# Patient Record
Sex: Female | Born: 1980 | Race: White | Hispanic: No | Marital: Single | Smoking: Current every day smoker
Health system: Southern US, Community
[De-identification: ages and names within clinical notes are randomized; demographics above are authoritative.]

## PROBLEM LIST (undated history)

## (undated) DIAGNOSIS — M357 Hypermobility syndrome: Secondary | ICD-10-CM

## (undated) DIAGNOSIS — M7918 Myalgia, other site: Secondary | ICD-10-CM

## (undated) DIAGNOSIS — Z720 Tobacco use: Secondary | ICD-10-CM

## (undated) HISTORY — DX: Hypermobility syndrome: M35.7

## (undated) HISTORY — PX: EYE SURGERY: SHX253

## (undated) HISTORY — PX: ABDOMINAL HYSTERECTOMY: SHX81

## (undated) HISTORY — PX: FRACTURE SURGERY: SHX138

## (undated) HISTORY — DX: Tobacco use: Z72.0

## (undated) HISTORY — DX: Myalgia, other site: M79.18

---

## 2018-10-03 ENCOUNTER — Emergency Department (INDEPENDENT_AMBULATORY_CARE_PROVIDER_SITE_OTHER)
Admission: EM | Admit: 2018-10-03 | Discharge: 2018-10-03 | Disposition: A | Discharge status: Home / Self Care | Payer: BC Managed Care – PPO | Source: Home / Self Care

## 2018-10-03 ENCOUNTER — Other Ambulatory Visit: Payer: Self-pay

## 2018-10-03 DIAGNOSIS — J039 Acute tonsillitis, unspecified: Secondary | ICD-10-CM

## 2018-10-03 DIAGNOSIS — R51 Headache: Secondary | ICD-10-CM

## 2018-10-03 DIAGNOSIS — R52 Pain, unspecified: Secondary | ICD-10-CM

## 2018-10-03 DIAGNOSIS — R519 Headache, unspecified: Secondary | ICD-10-CM

## 2018-10-03 NOTE — Discharge Instructions (Signed)
  You may take 500mg acetaminophen every 4-6 hours or in combination with ibuprofen 400-600mg every 6-8 hours as needed for pain, inflammation, and fever.  Be sure to well hydrated with clear liquids and get at least 8 hours of sleep at night, preferably more while sick.   Please follow up with family medicine in 1 week if needed.   

## 2018-10-03 NOTE — ED Triage Notes (Signed)
Sore throat on and off x 1 week.  White sores more on the left.  Headache and ear pain, more on the left.

## 2018-10-03 NOTE — ED Provider Notes (Signed)
[name]    CSN: [id number] Arrival date & time: 10/03/18  1140     History   Chief Complaint Chief Complaint  Patient presents with  . Sore Throat    HPI Thai Hemrick is a 38 y.o. female.   HPI Autumn Ray is a 38 y.o. female presenting to UC with c/o intermittent sore throat that started 1 week ago.  She has noticed white spots, more on her Left tonsil the last few days. Associated generalized HA, Left ear pain, body aches, and mild abdominal pain. Pt works at [employer] and is required to wear a mask due to Covid-19 protocol. Denies known sick contacts. Denies fever, chills, n/v/d.    History reviewed. No pertinent past medical history.  There are no active problems to display for this patient.   Past Surgical History:  Procedure Laterality Date  . ABDOMINAL HYSTERECTOMY    . EYE SURGERY    . FRACTURE SURGERY      OB History   No obstetric history on file.      Home Medications    Prior to Admission medications   Not on File    Family History History reviewed. No pertinent family history.  Social History Social History   Tobacco Use  . Smoking status: Not on file  Substance Use Topics  . Alcohol use: Not on file  . Drug use: Not on file     Allergies   Contrast media [iodinated diagnostic agents]   Review of Systems Review of Systems  Constitutional: Negative for chills and fever.  HENT: Negative for congestion, ear pain, trouble swallowing and voice change.   Respiratory: Negative for cough and shortness of breath.   Cardiovascular: Negative for chest pain and palpitations.  Gastrointestinal: Negative for abdominal pain, diarrhea, nausea and vomiting.  Musculoskeletal: Positive for arthralgias and myalgias. Negative for back pain.  Skin: Negative for rash.  Neurological: Positive for headaches. Negative for dizziness and light-headedness.     Physical Exam Triage Vital Signs ED Triage Vitals  Enc Vitals Group     BP 10/03/18 1207 121/79     Pulse Rate 10/03/18 1207 77     Resp 10/03/18 1207 20     Temp 10/03/18 1207 98.3 F (36.8 C)     Temp Source 10/03/18 1207 Oral     SpO2 10/03/18 1207 100 %     Weight 10/03/18 1208 157 lb (71.2 kg)     Height 10/03/18 1208 5\' 9"  (1.753 m)     Head Circumference --      Peak Flow --      Pain Score 10/03/18 1208 4     Pain Loc --      Pain Edu? --      Excl. in [place]? --    No data found.  Updated Vital Signs BP 121/79 (BP Location: Right Arm)   Pulse 77   Temp 98.3 F (36.8 C) (Oral)   Resp 20   Ht 5\' 9"  (1.753 m)   Wt 157 lb (71.2 kg)   SpO2 100%   BMI 23.18 kg/m   Visual Acuity Right Eye Distance:   Left Eye Distance:   Bilateral Distance:    Right Eye Near:   Left Eye Near:    Bilateral Near:     Physical Exam Vitals signs and nursing note reviewed.  Constitutional:      Appearance: She is well-developed.  HENT:     Head: Normocephalic and atraumatic.  Right Ear: Tympanic membrane normal.     Left Ear: Tympanic membrane normal.     Nose: Nose normal.     Right Sinus: No maxillary sinus tenderness or frontal sinus tenderness.     Left Sinus: No maxillary sinus tenderness or frontal sinus tenderness.     Mouth/Throat:     Lips: Pink.     Mouth: Mucous membranes are moist.     Pharynx: Uvula midline. Posterior oropharyngeal erythema present. No pharyngeal swelling or uvula swelling.     Tonsils: Tonsillar exudate (mainly Left side) present. No tonsillar abscesses.  Neck:     Musculoskeletal: Normal range of motion and neck supple.  Cardiovascular:     Rate and Rhythm: Normal rate and regular rhythm.  Pulmonary:     Effort: Pulmonary effort is normal. No respiratory distress.     Breath sounds: Normal breath sounds. No stridor. No wheezing, rhonchi or rales.  Musculoskeletal: Normal range of motion.  Lymphadenopathy:     Cervical: Cervical adenopathy present.  Skin:    General: Skin is warm and dry.  Neurological:      Mental Status: She is alert and oriented to person, place, and time.  Psychiatric:        Behavior: Behavior normal.      UC Treatments / Results  Labs (all labs ordered are listed, but only abnormal results are displayed) Labs Reviewed  STREP A DNA PROBE  SARS-COV-2 RNA, QUALITATIVE REAL-TIME RT-PCR  POCT RAPID STREP A (OFFICE)    EKG None  Radiology No results found.  Procedures Procedures (including critical care time)  Medications Ordered in UC Medications - No data to display  Initial Impression / Assessment and Plan / UC Course  I have reviewed the triage vital signs and the nursing notes.  Pertinent labs & imaging results that were available during my care of the patient were reviewed by me and considered in my medical decision making (see chart for details).     Rapid strep: NEGATIVE Culture sent Due to pt being exposed to the public throughout the day and multiple symptoms, [facility].  Final Clinical Impressions(s) / UC Diagnoses   Final diagnoses:  Exudative tonsillitis  Generalized headache  Body aches     Discharge Instructions      You may take 500mg  acetaminophen every 4-6 hours or in combination with ibuprofen 400-600mg  every 6-8 hours as needed for pain, inflammation, and fever.  Be sure to well hydrated with clear liquids and get at least 8 hours of sleep at night, preferably more while sick.   Please follow up with family medicine in 1 week if needed.     ED Prescriptions    None     Controlled Substance Prescriptions [place] Controlled Substance Registry consulted? Not Applicable   Rolla Platehelps, Anais Koenen O, PA-C 10/03/18 1310

## 2018-10-04 ENCOUNTER — Other Ambulatory Visit: Payer: Self-pay

## 2018-10-04 ENCOUNTER — Telehealth: Payer: Self-pay | Admitting: Emergency Medicine

## 2018-10-04 ENCOUNTER — Emergency Department
Admission: EM | Admit: 2018-10-04 | Discharge: 2018-10-04 | Disposition: A | Discharge status: Home / Self Care | Payer: BC Managed Care – PPO | Source: Home / Self Care | Attending: Emergency Medicine | Admitting: Emergency Medicine

## 2018-10-04 ENCOUNTER — Encounter: Payer: Self-pay | Admitting: Emergency Medicine

## 2018-10-04 DIAGNOSIS — J039 Acute tonsillitis, unspecified: Secondary | ICD-10-CM

## 2018-10-04 DIAGNOSIS — R1012 Left upper quadrant pain: Secondary | ICD-10-CM

## 2018-10-04 DIAGNOSIS — R5383 Other fatigue: Secondary | ICD-10-CM | POA: Diagnosis not present

## 2018-10-04 LAB — POCT CBC W AUTO DIFF (K'VILLE URGENT CARE)

## 2018-10-04 LAB — STREP A DNA PROBE: Group A Strep Probe: NOT DETECTED

## 2018-10-04 MED ORDER — CEPHALEXIN 500 MG PO CAPS: 500.0000 mg | ORAL_CAPSULE | Freq: Two times a day (BID) | ORAL | 0 refills | Status: DC

## 2018-10-04 MED ORDER — FLUCONAZOLE 150 MG PO TABS: 150.0000 mg | ORAL_TABLET | Freq: Once | ORAL | 0 refills | Status: AC

## 2018-10-04 NOTE — Telephone Encounter (Signed)
Patient called back to say not improving and is concerned; spoke with Dr.Daub and she will come in to day for additional labs.

## 2018-10-04 NOTE — Telephone Encounter (Signed)
Message left on voice mail inquiring about patient's status and encouraging patient to call with questions/concerns.Reported negative strep DNA.

## 2018-10-04 NOTE — Discharge Instructions (Addendum)
Please make sure you drink plenty of fluids. Continue ibuprofen and Tylenol. Take antibiotics as instructed. Please check regularly regarding your COVID test which was done yesterday. Please stay out of work until you have results of your COVID test.  I have ordered multiple blood test and will call you as we receive the results.  The culture may take up to a week.

## 2018-10-04 NOTE — ED Provider Notes (Signed)
[facility]    CSN: [id number] Arrival date & time: 10/04/18  1150     History   Chief Complaint Chief Complaint  Patient presents with  . Follow-up    HPI Autumn Ray is a 38 y.o. female.   HPI Patient was seen here yesterday and I asked her to return to clinic today for reevaluation.  She has developed patches of exudate involving both tonsils.  She has felt bad for approximately 1 week.  She initially started with myalgias and fatigue.  48 hours prior to admission she developed a severe sore throat associated with exudate.  She was seen yesterday throat culture was done and she was treated symptomatically with ibuprofen and Tylenol.  Strep screen was negative as well as strep DNA probe.  She enters today with severe sore throat associated with temperature 99+.  She does feel like her glands are also swollen.  She did had COVID testing done yesterday.  She has had a minimal cough. History reviewed. No pertinent past medical history.  There are no active problems to display for this patient.   Past Surgical History:  Procedure Laterality Date  . ABDOMINAL HYSTERECTOMY    . EYE SURGERY    . FRACTURE SURGERY      OB History   No obstetric history on file.      Home Medications    Prior to Admission medications   Medication Sig Start Date End Date Taking? Authorizing Provider  cephALEXin (KEFLEX) 500 MG capsule Take 1 capsule (500 mg total) by mouth 2 (two) times daily. 10/04/18   Collene Gobbleaub, Kaylan Yates A, MD  fluconazole (DIFLUCAN) 150 MG tablet Take 1 tablet (150 mg total) by mouth once for 1 dose. Repeat if needed 10/04/18 10/04/18  Collene Gobbleaub, Lanae Federer A, MD    Family History No family history on file.  Social History Social History   Tobacco Use  . Smoking status: Not on file  Substance Use Topics  . Alcohol use: Not on file  . Drug use: Not on file     Allergies   Contrast media [iodinated diagnostic agents]   Review of Systems Review of Systems   Constitutional: Positive for fatigue and fever.  HENT: Positive for sore throat.   Eyes: Negative.   Respiratory: Positive for cough.   Cardiovascular: Negative.   Gastrointestinal: Positive for abdominal pain.  Genitourinary: Negative.   Musculoskeletal: Positive for myalgias.  Neurological: Negative.   Psychiatric/Behavioral: Negative.      Physical Exam Triage Vital Signs ED Triage Vitals  Enc Vitals Group     BP 10/04/18 1209 117/82     Pulse --      Resp 10/04/18 1209 16     Temp 10/04/18 1209 98.6 F (37 C)     Temp Source 10/04/18 1209 Oral     SpO2 10/04/18 1209 99 %     Weight --      Height --      Head Circumference --      Peak Flow --      Pain Score 10/04/18 1210 3     Pain Loc --      Pain Edu? --      Excl. in [place]? --    No data found.  Updated Vital Signs BP 117/82 (BP Location: Right Arm)   Pulse 68   Temp 98.6 F (37 C) (Oral)   Resp 16   SpO2 99% Heart rate of 68 Heart rate 68 Visual Acuity Right Eye  Distance:   Left Eye Distance:   Bilateral Distance:    Right Eye Near:   Left Eye Near:    Bilateral Near:     Physical Exam Constitutional:      Appearance: Normal appearance. She is not toxic-appearing.  HENT:     Head: Normocephalic.     Right Ear: Tympanic membrane normal.     Left Ear: Tympanic membrane normal.     Nose: Nose normal.     Mouth/Throat:     Mouth: Mucous membranes are moist.     Pharynx: Oropharyngeal exudate and posterior oropharyngeal erythema present.     Comments: The tonsils are 2+ enlarged.  They are cryptic in appearance and the crypts are filled with exudate.  There are bilateral tender anterior cervical node but minimal posterior cervical nodes Eyes:     Pupils: Pupils are equal, round, and reactive to light.  Cardiovascular:     Rate and Rhythm: Normal rate and regular rhythm.  Pulmonary:     Effort: Pulmonary effort is normal.  Abdominal:     Comments: The abdomen is flat there is tenderness in the  midepigastrium extending into the left upper abdomen.  Musculoskeletal: Normal range of motion.  Lymphadenopathy:     Cervical: Cervical adenopathy present.  Skin:    General: Skin is warm and dry.  Neurological:     General: No focal deficit present.     Mental Status: She is alert.  Psychiatric:        Mood and Affect: Mood normal.        Behavior: Behavior normal.   White cell count 9100. 71% granulocytes. Hemoglobin 13.7. Platelet 247,000.   UC Treatments / Results  Labs (all labs ordered are listed, but only abnormal results are displayed) Labs Reviewed  HERPES SIMPLEX VIRUS CULTURE  EPSTEIN-BARR VIRUS VCA, IGM  EPSTEIN-BARR VIRUS VCA, IGG  EPSTEIN-BARR VIRUS NUCLEAR ANTIGEN ANTIBODY, IGG  EPSTEIN-BARR VIRUS EARLY D ANTIGEN ANTIBODY, IGG  CMV ABS, IGG+IGM (CYTOMEGALOVIRUS)  COMPLETE METABOLIC PANEL WITH GFR  POCT CBC W AUTO DIFF ([facility])  POCT CBC W AUTO DIFF ([facility])    EKG None  Radiology No results found.  Procedures Procedures (including critical care time)  Medications Ordered in UC Medications - No data to display  Initial Impression / Assessment and Plan / UC Course  I have reviewed the triage vital signs and the nursing notes.  Pertinent labs & imaging results that were available during my care of the patient were reviewed by me and considered in my medical decision making (see chart for details). Exam is suspicious of a viral type illness with exudative tonsillitis along with left upper quadrant abdominal pain and white count of 9100.  She does have cryptic tonsils with exudate.  Testing was done for EBV infection as well as CMV.  I also did a throat culture for HSV.  She has been in a stable relationship for 2 years.  She will be on cephalexin 500 3 times daily and given a prescription for Diflucan for secondary yeast infection.  Follow-up in 48 to 72 hours if not improving.  She was encouraged to force fluids and continue  ibuprofen and acetaminophen as instructed.     Final Clinical Impressions(s) / UC Diagnoses   Final diagnoses:  Exudative tonsillitis  Other fatigue  Abdominal pain, left upper quadrant     Discharge Instructions     Please make sure you drink plenty of fluids. Continue ibuprofen and  Tylenol. Take antibiotics as instructed. Please check regularly regarding your COVID test which was done yesterday. Please stay out of work until you have results of your COVID test.  I have ordered multiple blood test and will call you as we receive the results.  The culture may take up to a week.    ED Prescriptions    Medication Sig Dispense Auth. Provider   cephALEXin (KEFLEX) 500 MG capsule Take 1 capsule (500 mg total) by mouth 2 (two) times daily. 20 capsule Darlyne Russian, MD   fluconazole (DIFLUCAN) 150 MG tablet Take 1 tablet (150 mg total) by mouth once for 1 dose. Repeat if needed 2 tablet Darlyne Russian, MD     Controlled Substance Prescriptions [place] Controlled Substance Registry consulted? Not Applicable   Darlyne Russian, MD 10/04/18 1306

## 2018-10-04 NOTE — ED Triage Notes (Signed)
Patient here for follow up labs; when called today she reported worsening symptoms and spoke with Dr. Everlene Farrier it was decided she would come in for more labs.

## 2018-10-05 LAB — COMPLETE METABOLIC PANEL WITH GFR
AG Ratio: 1.7 (calc) (ref 1.0–2.5)
ALT: 8 U/L (ref 6–29)
AST: 10 U/L (ref 10–30)
Albumin: 3.8 g/dL (ref 3.6–5.1)
Alkaline phosphatase (APISO): 61 U/L (ref 31–125)
BUN: 7 mg/dL (ref 7–25)
CO2: 28 mmol/L (ref 20–32)
Calcium: 8.8 mg/dL (ref 8.6–10.2)
Chloride: 108 mmol/L (ref 98–110)
Creat: 0.61 mg/dL (ref 0.50–1.10)
GFR, Est African American: 132 mL/min/{1.73_m2} (ref >=60)
GFR, Est Non African American: 114 mL/min/{1.73_m2} (ref >=60)
Globulin: 2.3 g/dL (calc) (ref 1.9–3.7)
Glucose, Bld: 89 mg/dL (ref 65–99)
Potassium: 4.2 mmol/L (ref 3.5–5.3)
Sodium: 139 mmol/L (ref 135–146)
Total Bilirubin: 0.4 mg/dL (ref 0.2–1.2)
Total Protein: 6.1 g/dL (ref 6.1–8.1)

## 2018-10-05 LAB — SARS-COV-2 RNA,(COVID-19) QUALITATIVE NAAT: SARS CoV2 RNA: NOT DETECTED

## 2018-10-06 ENCOUNTER — Telehealth: Payer: Self-pay | Admitting: Emergency Medicine

## 2018-10-06 NOTE — Telephone Encounter (Signed)
Spoke with patient and told her cmp and covid negative; hsv pending. She states her throat is almost completely healed.

## 2018-10-07 LAB — CMV ABS, IGG+IGM (CYTOMEGALOVIRUS)
CMV IgM: <30 AU/mL
Cytomegalovirus Ab-IgG: 6.3 U/mL — ABNORMAL HIGH

## 2018-10-07 LAB — EPSTEIN-BARR VIRUS NUCLEAR ANTIGEN ANTIBODY, IGG: EBV NA IgG: 378 U/mL — ABNORMAL HIGH

## 2018-10-07 LAB — EPSTEIN-BARR VIRUS VCA, IGG: EBV VCA IgG: 482 U/mL — ABNORMAL HIGH

## 2018-10-07 LAB — EPSTEIN-BARR VIRUS VCA, IGM: EBV VCA IgM: <36 U/mL

## 2018-10-07 LAB — EPSTEIN-BARR VIRUS EARLY D ANTIGEN ANTIBODY, IGG: EBV EA IgG: 10 U/mL — ABNORMAL HIGH

## 2018-10-08 LAB — HERPES SIMPLEX VIRUS CULTURE
MICRO NUMBER:: 615591
SPECIMEN QUALITY:: ADEQUATE

## 2018-10-08 NOTE — Telephone Encounter (Signed)
Patient returned our call; all lab results given to her.

## 2018-10-08 NOTE — Telephone Encounter (Signed)
Message left on voice mail inquiring about patient's status and encouraging patient to call for lab results.

## 2018-10-14 LAB — POCT CBC W AUTO DIFF (K'VILLE URGENT CARE)

## 2019-08-28 ENCOUNTER — Other Ambulatory Visit: Payer: Self-pay

## 2019-08-28 ENCOUNTER — Emergency Department (INDEPENDENT_AMBULATORY_CARE_PROVIDER_SITE_OTHER)
Admission: EM | Admit: 2019-08-28 | Discharge: 2019-08-28 | Disposition: A | Discharge status: Home / Self Care | Payer: BC Managed Care – PPO | Source: Home / Self Care | Attending: Family Medicine | Admitting: Family Medicine

## 2019-08-28 DIAGNOSIS — J209 Acute bronchitis, unspecified: Secondary | ICD-10-CM

## 2019-08-28 DIAGNOSIS — J069 Acute upper respiratory infection, unspecified: Secondary | ICD-10-CM

## 2019-08-28 MED ORDER — ALBUTEROL SULFATE HFA 108 (90 BASE) MCG/ACT IN AERS: 2.0000 | INHALATION_SPRAY | Freq: Four times a day (QID) | RESPIRATORY_TRACT | 0 refills | Status: DC | PRN

## 2019-08-28 MED ORDER — BENZONATATE 200 MG PO CAPS: ORAL_CAPSULE | ORAL | 0 refills | Status: DC

## 2019-08-28 MED ORDER — AZITHROMYCIN 250 MG PO TABS: ORAL_TABLET | ORAL | 0 refills | Status: DC

## 2019-08-28 MED ORDER — PREDNISONE 20 MG PO TABS: ORAL_TABLET | ORAL | 0 refills | Status: DC

## 2019-08-28 NOTE — ED Triage Notes (Signed)
Pt c/o seasonal allergies that have progressed to what she describes as bronchitis. She has had same symptoms in the past years. She took mucinex otc and it has not relieved her symptoms. No concerns of covid exposure and she has been fully vaccinated.

## 2019-08-28 NOTE — Discharge Instructions (Addendum)
Take plain guaifenesin (1200mg extended release tabs such as Mucinex) twice daily, with plenty of water, for cough and congestion.  May add Pseudoephedrine (30mg, one or two every 4 to 6 hours) for sinus congestion.  Get adequate rest.   °Try warm salt water gargles for sore throat.  °Stop all antihistamines for now, and other non-prescription cough/cold preparations. °  °

## 2019-08-28 NOTE — ED Provider Notes (Signed)
[facility]    CSN: [id number] Arrival date & time: 08/28/19  1055      History   Chief Complaint Chief Complaint  Patient presents with  . Cough  . Nasal Congestion  . Chest Pain    unchanged pt has had bronchitis in the past several years no concerns    HPI Autumn Ray is a 39 y.o. female.   Patient has seasonal rhinitis.  About 2 weeks ago she developed a productive cough and increased sinus congestion.  Her cough has become worse.  During the past five days she has developed mild sore throat with cough and chills/sweats.  She states that she gets a similar persistent illness each spring and fall that usually requires treatment with prednisone.  She has had improvement in the past with an albuterol inhaler.  She has had COVID19 vaccinations. Her daughter has asthma.  The history is provided by the patient.    History reviewed. No pertinent past medical history.  There are no problems to display for this patient.   Past Surgical History:  Procedure Laterality Date  . ABDOMINAL HYSTERECTOMY    . EYE SURGERY    . FRACTURE SURGERY      OB History   No obstetric history on file.      Home Medications    Prior to Admission medications   Medication Sig Start Date End Date Taking? Authorizing Provider  albuterol (VENTOLIN HFA) 108 (90 Base) MCG/ACT inhaler Inhale 2 puffs into the lungs every 6 (six) hours as needed for wheezing or shortness of breath. 08/28/19   Lattie Haw, MD  azithromycin (ZITHROMAX Z-PAK) 250 MG tablet Take 2 tabs today; then begin one tab once daily for 4 more days. 08/28/19   Lattie Haw, MD  benzonatate (TESSALON) 200 MG capsule Take one cap by mouth at bedtime as needed for cough.  May repeat in 4 to 6 hours 08/28/19   Lattie Haw, MD  cephALEXin (KEFLEX) 500 MG capsule Take 1 capsule (500 mg total) by mouth 2 (two) times daily. 10/04/18   Collene Gobble, MD  predniSONE (DELTASONE) 20 MG tablet Take one tab by mouth  twice daily for 4 days, then one daily for 3 days. Take with food. 08/28/19   Lattie Haw, MD    Family History Family History  Problem Relation Age of Onset  . Heart failure Mother   . Stroke Mother   . Hyperlipidemia Mother   . COPD Mother   . Hypertension Sister   . Hyperlipidemia Sister   . Diabetes Sister     Social History Social History   Tobacco Use  . Smoking status: Former Smoker    Quit date: 04/09/2008    Years since quitting: 11.3  . Smokeless tobacco: Never Used  Substance Use Topics  . Alcohol use: Not on file  . Drug use: Not on file     Allergies   Contrast media [iodinated diagnostic agents]   Review of Systems Review of Systems + sore throat + cough No pleuritic pain ? wheezing + nasal congestion + post-nasal drainage + sinus pain/pressure No itchy/red eyes No earache No hemoptysis No SOB No fever, + chills/sweats No nausea No vomiting No abdominal pain No diarrhea No urinary symptoms No skin rash + fatigue No myalgias No headache    Physical Exam Triage Vital Signs ED Triage Vitals  Enc Vitals Group     BP 08/28/19 1104 124/81     Pulse --  Resp 08/28/19 1104 19     Temp 08/28/19 1104 99 F (37.2 C)     Temp Source 08/28/19 1104 Oral     SpO2 08/28/19 1104 99 %     Weight 08/28/19 1107 140 lb (63.5 kg)     Height 08/28/19 1107 5\' 9"  (1.753 m)     Head Circumference --      Peak Flow --      Pain Score 08/28/19 1107 0     Pain Loc --      Pain Edu? --      Excl. in [place]? --    No data found.  Updated Vital Signs BP 124/81 (BP Location: Left Arm)   Temp 99 F (37.2 C) (Oral)   Resp 19   Ht 5\' 9"  (1.753 m)   Wt 63.5 kg   SpO2 99%   BMI 20.67 kg/m   Visual Acuity Right Eye Distance:   Left Eye Distance:   Bilateral Distance:    Right Eye Near:   Left Eye Near:    Bilateral Near:     Physical Exam Nursing notes and Vital Signs reviewed. Appearance:  Patient appears stated age, and in no acute  distress Eyes:  Pupils are equal, round, and reactive to light and accomodation.  Extraocular movement is intact.  Conjunctivae are not inflamed  Ears:  Canals normal.  Tympanic membranes normal.  Nose:  Congested turbinates.  Maxillary sinus tenderness is present.  Pharynx:  Normal Neck:  Supple.  Mildly enlarged lateral nodes are present, tender to palpation on the left.   Lungs:  Clear to auscultation.  Breath sounds are equal.  Moving air well. Heart:  Regular rate and rhythm without murmurs, rubs, or gallops.  Abdomen:  Nontender without masses or hepatosplenomegaly.  Bowel sounds are present.  No CVA or flank tenderness.  Extremities:  No edema.  Skin:  No rash present.   UC Treatments / Results  Labs (all labs ordered are listed, but only abnormal results are displayed) Labs Reviewed - No data to display  EKG   Radiology No results found.  Procedures Procedures (including critical care time)  Medications Ordered in UC Medications - No data to display  Initial Impression / Assessment and Plan / UC Course  I have reviewed the triage vital signs and the nursing notes.  Pertinent labs & imaging results that were available during my care of the patient were reviewed by me and considered in my medical decision making (see chart for details).    Suspect mild underlying reactive airways disease.  Begin prednisone burst/taper, Z-Pak, Tessalon, and albuterol inhaler. Followup with Family Doctor if not improved in about 10 days.   Final Clinical Impressions(s) / UC Diagnoses   Final diagnoses:  Viral URI with cough  Acute bronchitis, unspecified organism     Discharge Instructions     Take plain guaifenesin (1200mg  extended release tabs such as Mucinex) twice daily, with plenty of water, for cough and congestion.  May add Pseudoephedrine (30mg , one or two every 4 to 6 hours) for sinus congestion.  Get adequate rest.   Try warm salt water gargles for sore throat.  Stop  all antihistamines for now, and other non-prescription cough/cold preparations.      ED Prescriptions    Medication Sig Dispense Auth. Provider   azithromycin (ZITHROMAX Z-PAK) 250 MG tablet Take 2 tabs today; then begin one tab once daily for 4 more days. 6 tablet 08/30/19, MD  predniSONE (DELTASONE) 20 MG tablet Take one tab by mouth twice daily for 4 days, then one daily for 3 days. Take with food. 11 tablet Kandra Nicolas, MD   benzonatate (TESSALON) 200 MG capsule Take one cap by mouth at bedtime as needed for cough.  May repeat in 4 to 6 hours 15 capsule Kandra Nicolas, MD   albuterol (VENTOLIN HFA) 108 (90 Base) MCG/ACT inhaler Inhale 2 puffs into the lungs every 6 (six) hours as needed for wheezing or shortness of breath. 18 g Kandra Nicolas, MD        Kandra Nicolas, MD 08/28/19 [phone]

## 2020-01-20 ENCOUNTER — Emergency Department (INDEPENDENT_AMBULATORY_CARE_PROVIDER_SITE_OTHER)
Admission: EM | Admit: 2020-01-20 | Discharge: 2020-01-20 | Disposition: A | Discharge status: Home / Self Care | Payer: BC Managed Care – PPO | Source: Home / Self Care | Attending: Family Medicine | Admitting: Family Medicine

## 2020-01-20 ENCOUNTER — Other Ambulatory Visit: Payer: Self-pay

## 2020-01-20 DIAGNOSIS — M79662 Pain in left lower leg: Secondary | ICD-10-CM

## 2020-01-20 NOTE — ED Provider Notes (Signed)
[facility]    CSN: [id number] Arrival date & time: 01/20/20  1748      History   Chief Complaint Chief Complaint  Patient presents with  . Leg Pain    Left    HPI Autumn Ray is a 39 y.o. female.   Patient noticed some vague mild pain in her left lateral calf yesterday.  At about one PM today she raised up on her toes (plantar flexion) and felt sudden sharp pain in her left lateral proximal calf.  She recalls no injury or recent change in activities.  She denies chest pain or shortness of breath.  No history of DVT.  The history is provided by the patient.  Leg Pain Location:  Leg Time since incident:  5 hours Injury: no   Leg location:  L lower leg Pain details:    Quality:  Sharp   Radiates to:  Does not radiate   Severity:  Moderate   Onset quality:  Sudden   Duration:  5 hours   Timing:  Constant   Progression:  Unchanged Chronicity:  New Prior injury to area:  No Relieved by:  None tried Worsened by:  Bearing weight and activity Ineffective treatments:  None tried Associated symptoms: no decreased ROM, no fatigue, no fever, no muscle weakness, no numbness, no stiffness, no swelling and no tingling     History reviewed. No pertinent past medical history.  There are no problems to display for this patient.   Past Surgical History:  Procedure Laterality Date  . ABDOMINAL HYSTERECTOMY    . EYE SURGERY    . FRACTURE SURGERY      OB History   No obstetric history on file.      Home Medications    Prior to Admission medications   Medication Sig Start Date End Date Taking? Authorizing Provider  albuterol (VENTOLIN HFA) 108 (90 Base) MCG/ACT inhaler Inhale 2 puffs into the lungs every 6 (six) hours as needed for wheezing or shortness of breath. 08/28/19   Lattie Haw, MD  azithromycin (ZITHROMAX Z-PAK) 250 MG tablet Take 2 tabs today; then begin one tab once daily for 4 more days. 08/28/19   Lattie Haw, MD  benzonatate (TESSALON)  200 MG capsule Take one cap by mouth at bedtime as needed for cough.  May repeat in 4 to 6 hours 08/28/19   Lattie Haw, MD  cephALEXin (KEFLEX) 500 MG capsule Take 1 capsule (500 mg total) by mouth 2 (two) times daily. 10/04/18   Collene Gobble, MD  predniSONE (DELTASONE) 20 MG tablet Take one tab by mouth twice daily for 4 days, then one daily for 3 days. Take with food. 08/28/19   Lattie Haw, MD    Family History Family History  Problem Relation Age of Onset  . Heart failure Mother   . Stroke Mother   . Hyperlipidemia Mother   . COPD Mother   . Hypertension Sister   . Hyperlipidemia Sister   . Diabetes Sister     Social History Social History   Tobacco Use  . Smoking status: Former Smoker    Quit date: 04/09/2008    Years since quitting: 11.7  . Smokeless tobacco: Never Used  Vaping Use  . Vaping Use: Every day  Substance Use Topics  . Alcohol use: Not Currently  . Drug use: Not on file     Allergies   Contrast media [iodinated diagnostic agents]   Review of Systems Review of  Systems  Constitutional: Negative for activity change, appetite change, chills, diaphoresis, fatigue and fever.  Respiratory: Negative for cough, chest tightness, shortness of breath, wheezing and stridor.   Cardiovascular: Negative for chest pain and leg swelling.  Musculoskeletal: Negative for joint swelling and stiffness.  Skin: Negative for color change.  All other systems reviewed and are negative.    Physical Exam Triage Vital Signs ED Triage Vitals  Enc Vitals Group     BP 01/20/20 1810 100/64     Pulse Rate 01/20/20 1810 85     Resp 01/20/20 1810 16     Temp 01/20/20 1810 98.4 F (36.9 C)     Temp Source 01/20/20 1810 Oral     SpO2 01/20/20 1810 99 %     Weight --      Height --      Head Circumference --      Peak Flow --      Pain Score 01/20/20 1808 8     Pain Loc --      Pain Edu? --      Excl. in GC? --    No data found.  Updated Vital Signs BP 100/64  (BP Location: Right Arm)   Pulse 85   Temp 98.4 F (36.9 C) (Oral)   Resp 16   SpO2 99%   Visual Acuity Right Eye Distance:   Left Eye Distance:   Bilateral Distance:    Right Eye Near:   Left Eye Near:    Bilateral Near:     Physical Exam Vitals and nursing note reviewed.  Constitutional:      General: She is not in acute distress. HENT:     Head: Normocephalic.     Mouth/Throat:     Pharynx: Oropharynx is clear.  Eyes:     Pupils: Pupils are equal, round, and reactive to light.  Cardiovascular:     Rate and Rhythm: Normal rate and regular rhythm.     Heart sounds: Normal heart sounds.  Pulmonary:     Effort: Pulmonary effort is normal. No respiratory distress.     Breath sounds: Normal breath sounds. No stridor.  Abdominal:     Palpations: Abdomen is soft.     Tenderness: There is no abdominal tenderness.  Musculoskeletal:     Cervical back: Neck supple.     Right lower leg: No edema.     Left lower leg: Tenderness present. No bony tenderness. No edema.       Legs:     Comments: Left proximal lower leg has vague localized tenderness to palpation laterally as noted on diagram.  There is no swelling, erythema, or warmth.  Distal neurovascular function is intact.   Lymphadenopathy:     Cervical: No cervical adenopathy.  Skin:    General: Skin is warm and dry.     Findings: No erythema.  Neurological:     Mental Status: She is alert.      UC Treatments / Results  Labs (all labs ordered are listed, but only abnormal results are displayed) Labs Reviewed  D-DIMER, QUANTITATIVE (NOT AT [facility])    EKG   Radiology No results found.  Procedures Procedures (including critical care time)  Medications Ordered in UC Medications - No data to display  Initial Impression / Assessment and Plan / UC Course  I have reviewed the triage vital signs and the nursing notes.  Pertinent labs & imaging results that were available during my care of the patient were  reviewed  by me and considered in my medical decision making (see chart for details).    Suspect muscle strain. Will check d-dimer as a precaution (if elevated, schedule venous US). Followup with Dr. Rodney [facility] ([facility]) if not improving about one wee.   Final Clinical Impressions(s) / UC Diagnoses   Final diagnoses:  Pain of left calf     Discharge Instructions     Elevate leg.  Apply ice pack for 20 to 30 minutes, 3 to 4 times daily  Continue until pain and swelling decrease.  May take Ibuprofen 200mg , 4 tabs every 8 hours with food.   If symptoms become significantly worse during the night or over the weekend, proceed to the local emergency room.     ED Prescriptions    None        , MD 01/24/20 [phone]

## 2020-01-20 NOTE — Discharge Instructions (Addendum)
Elevate leg.  Apply ice pack for 20 to 30 minutes, 3 to 4 times daily  Continue until pain and swelling decrease.  May take Ibuprofen 200mg , 4 tabs every 8 hours with food.   If symptoms become significantly worse during the night or over the weekend, proceed to the local emergency room.

## 2020-01-20 NOTE — ED Triage Notes (Signed)
Patient presents to Urgent Care with complaints of left calf pain since yesterday. Patient reports it hurts on either side about 1/3 of the way down from her knee, denies recent travel. No redness or swelling noted, is ambulatory but w/ limp.

## 2020-01-21 LAB — D-DIMER, QUANTITATIVE: D-Dimer, Quant: <0.19 ug{FEU}/mL

## 2020-02-02 ENCOUNTER — Telehealth: Payer: Self-pay | Admitting: Emergency Medicine

## 2020-02-02 NOTE — Telephone Encounter (Signed)
Call from Autumn Ray regarding the need for follow up regarding her left leg pain. Autumn Ray had an appointment w/ her new PCP and does not want to go back to that office [facility]). Pt had info for Dr T on discharge papers but has not made a follow up appointment. Autumn Ray also given Lexicographer for [facility] at [facility] as an alternative if she would like another option. [phone]. Patient thanked [place] for our time and for the pleasant experience she had here as a patient

## 2020-02-03 ENCOUNTER — Ambulatory Visit (INDEPENDENT_AMBULATORY_CARE_PROVIDER_SITE_OTHER): Payer: BC Managed Care – PPO | Admitting: Family Medicine

## 2020-02-03 ENCOUNTER — Ambulatory Visit: Payer: Self-pay

## 2020-02-03 ENCOUNTER — Encounter: Payer: Self-pay | Admitting: Family Medicine

## 2020-02-03 ENCOUNTER — Other Ambulatory Visit: Payer: Self-pay

## 2020-02-03 VITALS — BP 113/76 | HR 76 | Ht 69.0 in | Wt 135.0 lb

## 2020-02-03 DIAGNOSIS — S86812A Strain of other muscle(s) and tendon(s) at lower leg level, left leg, initial encounter: Secondary | ICD-10-CM | POA: Diagnosis not present

## 2020-02-03 DIAGNOSIS — S86819A Strain of other muscle(s) and tendon(s) at lower leg level, unspecified leg, initial encounter: Secondary | ICD-10-CM | POA: Insufficient documentation

## 2020-02-03 DIAGNOSIS — M79662 Pain in left lower leg: Secondary | ICD-10-CM

## 2020-02-03 NOTE — Assessment & Plan Note (Signed)
Injury occurred around 10/10.  Mild strain on ultrasound today. -Counseled on home exercise therapy and supportive care. -Provided heel lifts. -Counseled on compression. -Could consider physical therapy.

## 2020-02-03 NOTE — Patient Instructions (Signed)
Nice to meet you Please try the heel lifts.  Please try compression  Please try the exercises   Please send me a message in MyChart with any questions or updates.  Please see me back in 4 weeks.   --Dr. Jordan Likes

## 2020-02-03 NOTE — Progress Notes (Signed)
Autumn Ray - 39 y.o. female MRN [id number]  Date of birth: [birth year]  SUBJECTIVE:  Including CC & ROS.  Chief Complaint  Patient presents with  . Leg Pain    left calf x 2 weeks    Autumn Ray is a 39 y.o. female that is presenting with left calf pain.  The pain is been ongoing for about 10 days.  She reports stepping down having a painful sensation to the back of her calf.  The pain is still occurring.  No history of similar pain.  No history of surgery.  Pain is relieved with sitting down.  Review of Systems See HPI   HISTORY: Past Medical, Surgical, Social, and Family History Reviewed & Updated per EMR.   Pertinent Historical Findings include:  No past medical history on file.  Past Surgical History:  Procedure Laterality Date  . ABDOMINAL HYSTERECTOMY    . EYE SURGERY    . FRACTURE SURGERY      Family History  Problem Relation Age of Onset  . Heart failure Mother   . Stroke Mother   . Hyperlipidemia Mother   . COPD Mother   . Hypertension Sister   . Hyperlipidemia Sister   . Diabetes Sister     Social History   Socioeconomic History  . Marital status: Single    Spouse name: Not on file  . Number of children: Not on file  . Years of education: Not on file  . Highest education level: Not on file  Occupational History  . Not on file  Tobacco Use  . Smoking status: Former Smoker    Quit date: 04/09/2008    Years since quitting: 11.8  . Smokeless tobacco: Never Used  Vaping Use  . Vaping Use: Every day  Substance and Sexual Activity  . Alcohol use: Not Currently  . Drug use: Not on file  . Sexual activity: Not on file  Other Topics Concern  . Not on file  Social History Narrative  . Not on file   Social Determinants of Health   Financial Resource Strain:   . Difficulty of Paying Living Expenses: Not on file  Food Insecurity:   . Worried About Programme researcher, broadcasting/film/video in the Last Year: Not on file  . Ran Out of Food in the Last Year: Not on file    Transportation Needs:   . Lack of Transportation (Medical): Not on file  . Lack of Transportation (Non-Medical): Not on file  Physical Activity:   . Days of Exercise per Week: Not on file  . Minutes of Exercise per Session: Not on file  Stress:   . Feeling of Stress : Not on file  Social Connections:   . Frequency of Communication with Friends and Family: Not on file  . Frequency of Social Gatherings with Friends and Family: Not on file  . Attends Religious Services: Not on file  . Active Member of Clubs or Organizations: Not on file  . Attends Banker Meetings: Not on file  . Marital Status: Not on file  Intimate Partner Violence:   . Fear of Current or Ex-Partner: Not on file  . Emotionally Abused: Not on file  . Physically Abused: Not on file  . Sexually Abused: Not on file     PHYSICAL EXAM:  VS: BP 113/76   Pulse 76   Ht 5\' 9"  (1.753 m)   Wt 135 lb (61.2 kg)   BMI 19.94 kg/m  Physical Exam Gen: NAD, alert,  cooperative with exam, well-appearing MSK:  Left knee: Normal knee range of motion. Normal ankle range of motion. Pain with resistance to plantarflexion and dorsiflexion. Plantarflexion with Janee Morn test. Neurovascularly intact  Limited ultrasound: Left leg:  Normal-appearing Achilles tendon. Normal appearing musculotendinous junction of the Achilles into the medial gastrocnemius. There is a separation between the medial gastrocnemius and soleus to suggest a strain.  There is increased hyperemia in this area.  Summary: Findings associated with the calf strain.  Ultrasound and interpretation by Clare Gandy, MD    ASSESSMENT & PLAN:   Strain of calf muscle Injury occurred around 10/10.  Mild strain on ultrasound today. -Counseled on home exercise therapy and supportive care. -Provided heel lifts. -Counseled on compression. -Could consider physical therapy.

## 2020-03-01 ENCOUNTER — Ambulatory Visit: Payer: BC Managed Care – PPO | Admitting: Family Medicine

## 2020-03-02 ENCOUNTER — Ambulatory Visit: Payer: BC Managed Care – PPO | Admitting: Family Medicine

## 2020-03-09 ENCOUNTER — Ambulatory Visit (INDEPENDENT_AMBULATORY_CARE_PROVIDER_SITE_OTHER): Payer: BC Managed Care – PPO | Admitting: Family Medicine

## 2020-03-09 ENCOUNTER — Encounter: Payer: Self-pay | Admitting: Family Medicine

## 2020-03-09 ENCOUNTER — Other Ambulatory Visit: Payer: Self-pay

## 2020-03-09 VITALS — BP 118/76 | HR 69 | Ht 69.0 in | Wt 130.0 lb

## 2020-03-09 DIAGNOSIS — G2589 Other specified extrapyramidal and movement disorders: Secondary | ICD-10-CM | POA: Diagnosis not present

## 2020-03-09 DIAGNOSIS — M357 Hypermobility syndrome: Secondary | ICD-10-CM | POA: Diagnosis not present

## 2020-03-09 DIAGNOSIS — S86812D Strain of other muscle(s) and tendon(s) at lower leg level, left leg, subsequent encounter: Secondary | ICD-10-CM | POA: Diagnosis not present

## 2020-03-09 NOTE — Progress Notes (Signed)
Autumn Ray - 39 y.o. female MRN [id number]  Date of birth: 03/09/1981  SUBJECTIVE:  Including CC & ROS.  Chief Complaint  Patient presents with  . Follow-up    left calf muscle    Autumn Ray is a 39 y.o. female that is following up for left leg pain and presenting with right shoulder pain.  She has had improvement with the left calf pain.  It is intermittent and mild in nature.  She still notices it when she goes up and down ladders.  The right shoulder pain is posterior in nature.  It is more around the scapula.  She has had the pain for a few years.  She denies any injection.  She has gone through physical therapy with limited improvement.   Review of Systems See HPI   HISTORY: Past Medical, Surgical, Social, and Family History Reviewed & Updated per EMR.   Pertinent Historical Findings include:  No past medical history on file.  Past Surgical History:  Procedure Laterality Date  . ABDOMINAL HYSTERECTOMY    . EYE SURGERY    . FRACTURE SURGERY      Family History  Problem Relation Age of Onset  . Heart failure Mother   . Stroke Mother   . Hyperlipidemia Mother   . COPD Mother   . Hypertension Sister   . Hyperlipidemia Sister   . Diabetes Sister     Social History   Socioeconomic History  . Marital status: Single    Spouse name: Not on file  . Number of children: Not on file  . Years of education: Not on file  . Highest education level: Not on file  Occupational History  . Not on file  Tobacco Use  . Smoking status: Former Smoker    Quit date: 04/09/2008    Years since quitting: 11.9  . Smokeless tobacco: Never Used  Vaping Use  . Vaping Use: Every day  Substance and Sexual Activity  . Alcohol use: Not Currently  . Drug use: Not on file  . Sexual activity: Not on file  Other Topics Concern  . Not on file  Social History Narrative  . Not on file   Social Determinants of Health   Financial Resource Strain:   . Difficulty of Paying Living Expenses: Not  on file  Food Insecurity:   . Worried About Programme researcher, broadcasting/film/video in the Last Year: Not on file  . Ran Out of Food in the Last Year: Not on file  Transportation Needs:   . Lack of Transportation (Medical): Not on file  . Lack of Transportation (Non-Medical): Not on file  Physical Activity:   . Days of Exercise per Week: Not on file  . Minutes of Exercise per Session: Not on file  Stress:   . Feeling of Stress : Not on file  Social Connections:   . Frequency of Communication with Friends and Family: Not on file  . Frequency of Social Gatherings with Friends and Family: Not on file  . Attends Religious Services: Not on file  . Active Member of Clubs or Organizations: Not on file  . Attends Banker Meetings: Not on file  . Marital Status: Not on file  Intimate Partner Violence:   . Fear of Current or Ex-Partner: Not on file  . Emotionally Abused: Not on file  . Physically Abused: Not on file  . Sexually Abused: Not on file     PHYSICAL EXAM:  VS: BP 118/76   Pulse  69   Ht 5\' 9"  (1.753 m)   Wt 130 lb (59 kg)   BMI 19.20 kg/m  Physical Exam Gen: NAD, alert, cooperative with exam, well-appearing MSK:  Left leg: Normal strength resistance. Mild tenderness to palpation of the mid calf. Normal strength resistance. Right shoulder: Scapular function is impaired compared to contralateral side. Normal range of motion. Normal strength resistance. Normal empty can test. Normal speeds test. Normal O'Brien's test. Neurovascularly intact     ASSESSMENT & PLAN:   Strain of calf muscle Pain is only mild and intermittent.  She has had continued improvement of her symptoms. -Counseled on home exercise therapy and supportive care. -Counseled on compression. -Could consider further imaging if needed.  Hypermobility syndrome Beighton score 7/9. Likely contributing to some of her ongoing shoulder pain.  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  Has good strength on exam. -Counseled on home exercise therapy and supportive care. -Could consider physical therapy or further imaging.

## 2020-03-09 NOTE — Patient Instructions (Signed)
Good to see you Please try the exercises  Please try heat  Please try a thera cane or a lacrosse ball   Please send me a message in MyChart with any questions or updates.  Please see me back in 4-6 weeks.   --Dr. Jordan Likes

## 2020-03-09 NOTE — Assessment & Plan Note (Signed)
Beighton score 7/9. Likely contributing to some of her ongoing shoulder pain.

## 2020-03-09 NOTE — Assessment & Plan Note (Signed)
Pain is only mild and intermittent.  She has had continued improvement of her symptoms. -Counseled on home exercise therapy and supportive care. -Counseled on compression. -Could consider further imaging if needed.

## 2020-03-09 NOTE — Assessment & Plan Note (Signed)
The right scapula does not function as well as the left.  Has good strength on exam. -Counseled on home exercise therapy and supportive care. -Could consider physical therapy or further imaging.

## 2020-04-14 ENCOUNTER — Other Ambulatory Visit: Payer: Self-pay

## 2020-04-14 ENCOUNTER — Ambulatory Visit (INDEPENDENT_AMBULATORY_CARE_PROVIDER_SITE_OTHER): Payer: BC Managed Care – PPO | Admitting: Family Medicine

## 2020-04-14 ENCOUNTER — Ambulatory Visit: Payer: Self-pay

## 2020-04-14 VITALS — BP 126/76 | Ht 69.0 in | Wt 135.0 lb

## 2020-04-14 DIAGNOSIS — G2589 Other specified extrapyramidal and movement disorders: Secondary | ICD-10-CM | POA: Diagnosis not present

## 2020-04-14 MED ORDER — CYCLOBENZAPRINE HCL 10 MG PO TABS: 5.0000 mg | ORAL_TABLET | Freq: Three times a day (TID) | ORAL | 0 refills | Status: DC | PRN

## 2020-04-14 MED ORDER — PREDNISONE 5 MG PO TABS: ORAL_TABLET | ORAL | 0 refills | Status: DC

## 2020-04-14 NOTE — Assessment & Plan Note (Signed)
Most of her pain is on the medial and inferior border of the scapula.  She has minor changes around the shoulder joint itself.  Her symptoms are likely related to hypermobility. -Counseled on home exercise therapy and supportive care. -Prednisone. -Flexeril. -Referral to physical therapy. -Could consider trigger point injections.

## 2020-04-14 NOTE — Progress Notes (Signed)
  Autumn Ray - 40 y.o. female MRN [id number]  Date of birth: [birth year]  SUBJECTIVE:  Including CC & ROS.  No chief complaint on file.   Autumn Ray is a 40 y.o. female that is presenting with worsening right scapular pain.  Pain is occurring over the medial border and inferior border of the scapula.  It is worse with certain movements.  It is worse when she rolls over in bed at night.   Review of Systems See HPI   HISTORY: Past Medical, Surgical, Social, and Family History Reviewed & Updated per EMR.   Pertinent Historical Findings include:  No past medical history on file.  Past Surgical History:  Procedure Laterality Date  . ABDOMINAL HYSTERECTOMY    . EYE SURGERY    . FRACTURE SURGERY      Family History  Problem Relation Age of Onset  . Heart failure Mother   . Stroke Mother   . Hyperlipidemia Mother   . COPD Mother   . Hypertension Sister   . Hyperlipidemia Sister   . Diabetes Sister     Social History   Socioeconomic History  . Marital status: Single    Spouse name: Not on file  . Number of children: Not on file  . Years of education: Not on file  . Highest education level: Not on file  Occupational History  . Not on file  Tobacco Use  . Smoking status: Former Smoker    Quit date: 04/09/2008    Years since quitting: 12.0  . Smokeless tobacco: Never Used  Vaping Use  . Vaping Use: Every day  Substance and Sexual Activity  . Alcohol use: Not Currently  . Drug use: Not on file  . Sexual activity: Not on file  Other Topics Concern  . Not on file  Social History Narrative  . Not on file   Social Determinants of Health   Financial Resource Strain: Not on file  Food Insecurity: Not on file  Transportation Needs: Not on file  Physical Activity: Not on file  Stress: Not on file  Social Connections: Not on file  Intimate Partner Violence: Not on file     PHYSICAL EXAM:  VS: BP 126/76   Ht 5\' 9"  (1.753 m)   Wt 135 lb (61.2 kg)   BMI 19.94 kg/m   Physical Exam Gen: NAD, alert, cooperative with exam, well-appearing MSK:  Right shoulder: Normal range of motion. Normal strength resistance. Tenderness to palpation at the inferior medial border of the scapula. Neurovascular intact  Limited ultrasound: Right shoulder:  Normal-appearing biceps tendon. Normal-appearing subscapularis. There is a partial interstitial tear within the mid substance of the supraspinatus. It external rotation of the posterior glenohumeral joint shows some lability of the posterior labrum.  Summary: Minor change in supraspinatus and degenerative changes of the labrum.  Ultrasound and interpretation by , MD    ASSESSMENT & PLAN:   Scapular dyskinesis Most of her pain is on the medial and inferior border of the scapula.  She has minor changes around the shoulder joint itself.  Her symptoms are likely related to hypermobility. -Counseled on home exercise therapy and supportive care. -Prednisone. -Flexeril. -Referral to physical therapy. -Could consider trigger point injections.

## 2020-04-14 NOTE — Patient Instructions (Signed)
Good to see you Happy belated [place] Ouch!  Please try heat  Please try compression   Please try a thera cane or lacrosse ball  Please send me a message in MyChart with any questions or updates.  Please see me back in 4-6 weeks.   --Dr. Jordan Likes

## 2020-04-20 ENCOUNTER — Ambulatory Visit (INDEPENDENT_AMBULATORY_CARE_PROVIDER_SITE_OTHER): Payer: BC Managed Care – PPO | Admitting: Physical Therapy

## 2020-04-20 ENCOUNTER — Other Ambulatory Visit: Payer: Self-pay

## 2020-04-20 ENCOUNTER — Encounter: Payer: Self-pay | Admitting: Physical Therapy

## 2020-04-20 DIAGNOSIS — G8929 Other chronic pain: Secondary | ICD-10-CM

## 2020-04-20 DIAGNOSIS — M6281 Muscle weakness (generalized): Secondary | ICD-10-CM | POA: Diagnosis not present

## 2020-04-20 DIAGNOSIS — M25511 Pain in right shoulder: Secondary | ICD-10-CM

## 2020-04-20 DIAGNOSIS — M357 Hypermobility syndrome: Secondary | ICD-10-CM

## 2020-04-20 NOTE — Patient Instructions (Signed)
Access Code: [email or web address] URL: [email or web address] Date: 04/20/2020 Prepared by: Reggy [facility]  Exercises Standing Shoulder Horizontal Abduction with Resistance - 1 x daily - 7 x weekly - 3 sets - 10 reps Shoulder External Rotation Reactive Isometrics - 1 x daily - 7 x weekly - 3 sets - 10 reps Shoulder Internal Rotation Reactive Isometrics - 1 x daily - 7 x weekly - 3 sets - 10 reps

## 2020-04-20 NOTE — Therapy (Signed)
[facility] [place] [address] [place], [place], [address] Phone: [phone]   Fax:  [phone]  Physical Therapy Evaluation  Patient Details  Name: Autumn Ray MRN: [id number] Date of Birth: May 26, 1980 Referring Provider (PT): schmitz, jeremy   Encounter Date: 04/20/2020   PT End of Session - 04/20/20 1519    Visit Number 1    Number of Visits 12    Date for PT Re-Evaluation 06/01/20    PT Start Time 1430    PT Stop Time 1515    PT Time Calculation (min) 45 min    Activity Tolerance Patient tolerated treatment well    Behavior During Therapy [facility] for tasks assessed/performed           History reviewed. No pertinent past medical history.  Past Surgical History:  Procedure Laterality Date  . ABDOMINAL HYSTERECTOMY    . EYE SURGERY    . FRACTURE SURGERY      There were no vitals filed for this visit.    Subjective Assessment - 04/20/20 1435    Subjective Pt had Rt shoulder injury in 2018 and again in 2019 with both instances being workers comp injuries. Pt states pain has never gotten better and has recently gotten worse. Pt recently saw a new MD who diagnoses her with Rt shoulder hypermobility and pt states MD told her she has a slight tear in Rt rotator cuff mm. Pain worse with movement, better with rest and heat    Pertinent History shoulder injuries 2018 and 2019    Diagnostic tests ultrasound-results not final    Patient Stated Goals decrease pain    Currently in Pain? Yes    Pain Score 6     Pain Location Shoulder    Pain Orientation Right    Pain Descriptors / Indicators Burning;Aching    Pain Type Chronic pain    Pain Onset More than a month ago    Pain Frequency Intermittent    Aggravating Factors  movement    Pain Relieving Factors rest, heat    Effect of Pain on Daily Activities difficulty working              [facility] PT Assessment - 04/20/20 0001      Assessment   Medical Diagnosis scapular  dyskinesis    Referring Provider (PT) schmitz, jeremy    Onset Date/Surgical Date 03/14/17      Precautions   Precautions None      Balance Screen   Has the patient fallen in the past 6 months No      Observation/Other Assessments   Focus on Therapeutic Outcomes (FOTO)  functional status measure 54      ROM / Strength   AROM / PROM / Strength AROM;Strength      AROM   Overall AROM Comments bilat shoulder with increased ROM in all directions      Strength   Strength Assessment Site Shoulder;Hand    Right/Left Shoulder Right;Left    Right Shoulder Flexion 3+/5    Right Shoulder ABduction 4/5    Right Shoulder Internal Rotation 3+/5    Right Shoulder External Rotation 3+/5    Left Shoulder Flexion 4/5    Left Shoulder ABduction 4+/5    Left Shoulder Internal Rotation 4/5    Left Shoulder External Rotation 4/5    Right/Left hand Right;Left    Right Hand Grip (lbs) 63    Left Hand Grip (lbs) 70      Palpation  Palpation comment hypermobile in Rt shoulder joint, scapular mobility WNL                      Objective measurements completed on examination: See above findings.       [facility] Adult PT Treatment/Exercise - 04/20/20 0001      Exercises   Exercises Shoulder      Shoulder Exercises: Seated   Horizontal ABduction Strengthening;Both;10 reps    Theraband Level (Shoulder Horizontal ABduction) Level 2 (Red)      Shoulder Exercises: Standing   Other Standing Exercises ER/IR reactive isometrics x 10 red TB    Other Standing Exercises scapular clocks x 10 bilat                  PT Education - 04/20/20 1511    Education Details PT POC and goals, HEP    Person(s) Educated Patient    Methods Explanation;Demonstration;Handout    Comprehension Returned demonstration;Verbalized understanding               PT Long Term Goals - 04/20/20 1531      PT LONG TERM GOAL #1   Title Pt will be independent in HEP    Time 6    Period Weeks     Status New    Target Date 06/01/20      PT LONG TERM GOAL #2   Title Pt will improve FOTO functional status measure to >= 75 to demonstrate improved functional mobility    Time 6    Period Weeks    Status New    Target Date 06/01/20      PT LONG TERM GOAL #3   Title Pt will improve Rt grip strength to >=70lbs to work with decreased pain    Time 6    Period Weeks    Status New    Target Date 06/01/20      PT LONG TERM GOAL #4   Title Pt will improve Rt shoulder strength to 4+/5 to lift and carry items without increase in symptoms    Time 6    Period Weeks    Status New    Target Date 06/01/20      PT LONG TERM GOAL #5   Title Pt will sleep x 6 hours with pain <= 2/10    Time 6    Period Weeks    Status New    Target Date 06/01/20                  Plan - 04/20/20 1520    Clinical Impression Statement Pt presents with hypermobility in Rt shoulder joint, increased Rt shoulder pain, decreased muscle strength and decreased functional activity tolerance. Pt will benefit from skilled PT to address deficits and improve functional mobility    Personal Factors and Comorbidities Past/Current Experience;Time since onset of injury/illness/exacerbation    Examination-Activity Limitations Carry;Reach Overhead    Examination-Participation Restrictions Occupation    Stability/Clinical Decision Making Evolving/Moderate complexity    Clinical Decision Making Moderate    Rehab Potential Good    PT Frequency 2x / week    PT Duration 6 weeks    PT Treatment/Interventions Taping;Manual techniques;Dry needling;Cryotherapy;Electrical Stimulation;Neuromuscular re-education;Therapeutic exercise;Therapeutic activities;Moist Heat;Patient/family education;Iontophoresis 4mg /ml Dexamethasone    PT Next Visit Plan assess HEP, rhythmic stabilization, isometrics as tolerated    PT Home Exercise Plan    Consulted and Agree with Plan of Care Patient  Patient will benefit from  skilled therapeutic intervention in order to improve the following deficits and impairments:  Impaired UE functional use,Decreased activity tolerance,Decreased strength,Hypermobility,Pain  Visit Diagnosis: Chronic right shoulder pain - Plan: PT plan of care cert/re-cert  Muscle weakness (generalized) - Plan: PT plan of care cert/re-cert  Hypermobility syndrome - Plan: PT plan of care cert/re-cert     Problem List Patient Active Problem List   Diagnosis Date Noted  . 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 Hypermobility syndrome 03/09/2020  . Strain of calf muscle 02/03/2020   Preslynn Bier, PT  Jastin Fore 04/20/2020, 3:39 PM  [facility] [address] [place], [place], [address] Phone: [phone]   Fax:  [phone]  Name: Autumn Ray MRN: [id number] Date of Birth: [birth year]

## 2020-04-27 ENCOUNTER — Telehealth: Payer: Self-pay | Admitting: Family Medicine

## 2020-04-27 ENCOUNTER — Ambulatory Visit (INDEPENDENT_AMBULATORY_CARE_PROVIDER_SITE_OTHER): Payer: BC Managed Care – PPO | Admitting: Physical Therapy

## 2020-04-27 ENCOUNTER — Other Ambulatory Visit: Payer: Self-pay

## 2020-04-27 DIAGNOSIS — G2589 Other specified extrapyramidal and movement disorders: Secondary | ICD-10-CM

## 2020-04-27 DIAGNOSIS — M357 Hypermobility syndrome: Secondary | ICD-10-CM | POA: Diagnosis not present

## 2020-04-27 DIAGNOSIS — M25511 Pain in right shoulder: Secondary | ICD-10-CM

## 2020-04-27 DIAGNOSIS — M6281 Muscle weakness (generalized): Secondary | ICD-10-CM | POA: Diagnosis not present

## 2020-04-27 DIAGNOSIS — G8929 Other chronic pain: Secondary | ICD-10-CM

## 2020-04-27 NOTE — Telephone Encounter (Signed)
Patient called states just went to PT & it was excruciatingly painful & she says provider mentioned an MRI,she wants to proceed with getting on as soon as possible @ whatever location has the 1st available slot ([facility] or [place] mobile unit )  --Forwarding pt request to Dr.Schmitz.  -glh

## 2020-04-27 NOTE — Patient Instructions (Signed)
Access Code: [email or web address] URL: [email or web address] Date: 04/27/2020 Prepared by: Reggy Eye  Exercises Supine Shoulder Horizontal Abduction with Resistance - 1 x daily - 7 x weekly - 3 sets - 10 reps Dynamic Hug with Resistance - 1 x daily - 7 x weekly - 3 sets - 10 reps  Patient Education Trigger Point Dry Needling

## 2020-04-27 NOTE — Therapy (Addendum)
[facility] [place] [address] [place], [place], [address] Phone: [phone]   Fax:  [phone]  Physical Therapy Treatment and Discharge  Patient Details  Name: Autumn Ray MRN: [id number] Date of Birth: January 14, 1981 Referring Provider (PT): schmitz, jeremy   Encounter Date: 04/27/2020   PT End of Session - 04/27/20 1428    Visit Number 2    Number of Visits 12    Date for PT Re-Evaluation 06/01/20    PT Start Time 6962    PT Stop Time 1425    PT Time Calculation (min) 38 min    Activity Tolerance Patient tolerated treatment well    Behavior During Therapy [facility] for tasks assessed/performed           No past medical history on file.  Past Surgical History:  Procedure Laterality Date  . ABDOMINAL HYSTERECTOMY    . EYE SURGERY    . FRACTURE SURGERY      There were no vitals filed for this visit.   Subjective Assessment - 04/27/20 1349    Subjective Pt states she has a spot on her Rt shoulder blade that swells up and hurts    Patient Stated Goals decrease pain    Currently in Pain? Yes    Pain Score 6     Pain Location Scapula    Pain Orientation Right    Pain Descriptors / Indicators Aching;Burning    Pain Type Chronic pain    Pain Onset More than a month ago                             [facility] Adult PT Treatment/Exercise - 04/27/20 0001      Shoulder Exercises: Supine   Horizontal ABduction Strengthening;Both;20 reps    Theraband Level (Shoulder Horizontal ABduction) Level 2 (Red)      Shoulder Exercises: Seated   Other Seated Exercises resisted hug with red TB 2 x 10      Manual Therapy   Manual Therapy Soft tissue mobilization    Manual therapy comments skilled palpation to assess response to dry needling    Soft tissue mobilization TPR Rt subscap, middle traps, rhomboids            Trigger Point Dry Needling - 04/27/20 0001    Consent Given? Yes    Education Handout Provided  Yes    Muscles Treated Upper Quadrant Rhomboids;Subscapularis;Middle trapezius    Rhomboids Response Twitch response elicited    Subscapularis Response Twitch response elicited    Middle trapezius Response Twitch response elicited                PT Education - 04/27/20 1421    Education Details dry needling, updated HEP    Person(s) Educated Patient    Methods Demonstration;Explanation;Handout    Comprehension Returned demonstration;Verbalized understanding               PT Long Term Goals - 04/20/20 1531      PT LONG TERM GOAL #1   Title Pt will be independent in HEP    Time 6    Period Weeks    Status New    Target Date 06/01/20      PT LONG TERM GOAL #2   Title Pt will improve FOTO functional status measure to >= 75 to demonstrate improved functional mobility    Time 6    Period Weeks    Status New  Target Date 06/01/20      PT LONG TERM GOAL #3   Title Pt will improve Rt grip strength to >=70lbs to work with decreased pain    Time 6    Period Weeks    Status New    Target Date 06/01/20      PT LONG TERM GOAL #4   Title Pt will improve Rt shoulder strength to 4+/5 to lift and carry items without increase in symptoms    Time 6    Period Weeks    Status New    Target Date 06/01/20      PT LONG TERM GOAL #5   Title Pt will sleep x 6 hours with pain <= 2/10    Time 6    Period Weeks    Status New    Target Date 06/01/20                 Plan - 04/27/20 1430    Clinical Impression Statement Pt responds well to dry needling. Multiple trigger points in subscap, rhomboids and middle trap. HEP updated    PT Treatment/Interventions Taping;Manual techniques;Dry needling;Cryotherapy;Electrical Stimulation;Neuromuscular re-education;Therapeutic exercise;Therapeutic activities;Moist Heat;Patient/family education;Iontophoresis 4mg /ml Dexamethasone    PT Next Visit Plan assess HEP, dry needling. progress as tolerated    PT Home Exercise Plan [email or web address]     WCHENIDPO and Agree with Plan of Care Patient           Patient will benefit from skilled therapeutic intervention in order to improve the following deficits and impairments:     Visit Diagnosis: Muscle weakness (generalized)  Hypermobility syndrome  Chronic right shoulder pain     Problem List Patient Active Problem List   Diagnosis Date Noted  . 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 Hypermobility syndrome 03/09/2020  . Strain of calf muscle 02/03/2020   PHYSICAL THERAPY DISCHARGE SUMMARY  Visits from Start of Care: 2  Current functional level related to goals / functional outcomes: Pt still with significant pain and swelling   Remaining deficits: See above   Education / Equipment: HEP  Plan: Patient agrees to discharge.  Patient goals were not met. Patient is being discharged due to not returning since the last visit.  ?????      Isabelle Course, PT,DPT02/18/2212:54 PM  Gradie Ohm, PT  Isabelle Course 04/27/2020, 2:31 PM  [facility] [place], [place], [address] Phone: [phone]   Fax:  [phone]  Name: Autumn Ray MRN: [id number] Date of Birth: [birth year]

## 2020-04-28 NOTE — Telephone Encounter (Signed)
Spoke with patient about her symptoms.  A labral pathology may be the reason as to why the scapula is functioning abnormally.  We will pursue x-ray and MRI to evaluate for any labral tear.  Myra Rude, MD [facility] 04/28/2020, 3:26 PM

## 2020-04-29 ENCOUNTER — Encounter: Payer: BC Managed Care – PPO | Admitting: Physical Therapy

## 2020-05-04 ENCOUNTER — Encounter: Payer: BC Managed Care – PPO | Admitting: Physical Therapy

## 2020-05-06 ENCOUNTER — Encounter: Payer: BC Managed Care – PPO | Admitting: Physical Therapy

## 2020-05-16 ENCOUNTER — Ambulatory Visit
Admission: RE | Admit: 2020-05-16 | Discharge: 2020-05-16 | Disposition: A | Discharge status: Home / Self Care | Payer: BC Managed Care – PPO | Source: Ambulatory Visit | Attending: Family Medicine | Admitting: Family Medicine

## 2020-05-16 ENCOUNTER — Other Ambulatory Visit: Payer: Self-pay

## 2020-05-16 DIAGNOSIS — G2589 Other specified extrapyramidal and movement disorders: Secondary | ICD-10-CM

## 2020-05-18 ENCOUNTER — Other Ambulatory Visit: Payer: Self-pay

## 2020-05-18 ENCOUNTER — Telehealth (INDEPENDENT_AMBULATORY_CARE_PROVIDER_SITE_OTHER): Payer: BC Managed Care – PPO | Admitting: Family Medicine

## 2020-05-18 DIAGNOSIS — G2589 Other specified extrapyramidal and movement disorders: Secondary | ICD-10-CM | POA: Diagnosis not present

## 2020-05-18 NOTE — Assessment & Plan Note (Signed)
MRI was reassuring. It does appear that there is muscle edema around the scapular.  - counseled on home exercise therapy and supportive care - try trigger point injection or sub scapular bursa injection

## 2020-05-18 NOTE — Progress Notes (Signed)
Virtual Visit via Video Note  I connected with Autumn Ray on 05/18/20 at  2:10 PM EST by a video enabled telemedicine application and verified that I am speaking with the correct person using two identifiers.  Location: Patient: vehicle Provider: home   I discussed the limitations of evaluation and management by telemedicine and the availability of in person appointments. The patient expressed understanding and agreed to proceed.  History of Present Illness:  Autumn Ray is a 40 yo F that is following up after her MRI. This showed tendinosis. There does appear to be muscle edema around the scapula. There is where the majority of her pain is.    Observations/Objective:  Gen: NAD, alert, cooperative with exam, well-appearing  Assessment and Plan:  Scapular Dyskinesis:  MRI was reassuring. It does appear that there is muscle edema around the scapular.  - counseled on home exercise therapy and supportive care - try trigger point injection or sub scapular bursa injection  Follow Up Instructions:    I discussed the assessment and treatment plan with the patient. The patient was provided an opportunity to ask questions and all were answered. The patient agreed with the plan and demonstrated an understanding of the instructions.   The patient was advised to call back or seek an in-person evaluation if the symptoms worsen or if the condition fails to improve as anticipated.   Clare Gandy, MD

## 2020-05-27 ENCOUNTER — Telehealth: Payer: Self-pay | Admitting: Family Medicine

## 2020-06-01 ENCOUNTER — Ambulatory Visit (INDEPENDENT_AMBULATORY_CARE_PROVIDER_SITE_OTHER): Payer: BC Managed Care – PPO | Admitting: Family Medicine

## 2020-06-01 ENCOUNTER — Other Ambulatory Visit: Payer: Self-pay

## 2020-06-01 VITALS — BP 84/64 | Ht 69.0 in | Wt 140.0 lb

## 2020-06-01 DIAGNOSIS — M7918 Myalgia, other site: Secondary | ICD-10-CM | POA: Diagnosis not present

## 2020-06-01 MED ORDER — METHYLPREDNISOLONE ACETATE 40 MG/ML IJ SUSP
40.0000 mg | Freq: Once | INTRAMUSCULAR | Status: AC
  Administered 2020-06-01: 40 mg via INTRA_ARTICULAR

## 2020-06-01 NOTE — Assessment & Plan Note (Signed)
Recent MRI of the right shoulder demonstrated no shoulder pathology.  Did seem to have some edema surrounding the scapula. -Trigger point injections today.

## 2020-06-01 NOTE — Patient Instructions (Signed)
Good to see you Please try heat  Please let me know if your pain comes back or if you get swelling again   Please send me a message in MyChart with any questions or updates.  Please see me back in 4 weeks.   --Dr. Jordan Likes

## 2020-06-01 NOTE — Progress Notes (Signed)
  Autumn Ray - 40 y.o. female MRN [id number]  Date of birth: [birth year]  SUBJECTIVE:  Including CC & ROS.  No chief complaint on file.   Autumn Ray is a 40 y.o. female that is presenting for trigger point injections.    Review of Systems See HPI   HISTORY: Past Medical, Surgical, Social, and Family History Reviewed & Updated per EMR.   Pertinent Historical Findings include:  No past medical history on file.  Past Surgical History:  Procedure Laterality Date  . ABDOMINAL HYSTERECTOMY    . EYE SURGERY    . FRACTURE SURGERY      Family History  Problem Relation Age of Onset  . Heart failure Mother   . Stroke Mother   . Hyperlipidemia Mother   . COPD Mother   . Hypertension Sister   . Hyperlipidemia Sister   . Diabetes Sister     Social History   Socioeconomic History  . Marital status: Single    Spouse name: Not on file  . Number of children: Not on file  . Years of education: Not on file  . Highest education level: Not on file  Occupational History  . Not on file  Tobacco Use  . Smoking status: Former Smoker    Quit date: 04/09/2008    Years since quitting: 12.1  . Smokeless tobacco: Never Used  Vaping Use  . Vaping Use: Every day  Substance and Sexual Activity  . Alcohol use: Not Currently  . Drug use: Not on file  . Sexual activity: Not on file  Other Topics Concern  . Not on file  Social History Narrative  . Not on file   Social Determinants of Health   Financial Resource Strain: Not on file  Food Insecurity: Not on file  Transportation Needs: Not on file  Physical Activity: Not on file  Stress: Not on file  Social Connections: Not on file  Intimate Partner Violence: Not on file     PHYSICAL EXAM:  VS: BP (!) 84/64   Ht 5\' 9"  (1.753 m)   Wt 140 lb (63.5 kg)   BMI 20.67 kg/m  Physical Exam Gen: NAD, alert, cooperative with exam, well-appearing    Aspiration/Injection Procedure Note Autumn Ray 05-Apr-1981  Procedure:  Injection Indications: right trigger points   Procedure Details Consent: Risks of procedure as well as the alternatives and risks of each were explained to the (patient/caregiver).  Consent for procedure obtained. Time Out: Verified patient identification, verified procedure, site/side was marked, verified correct patient position, special equipment/implants available, medications/allergies/relevent history reviewed, required imaging and test results available.  Performed.  The area was cleaned with iodine and alcohol swabs.    The right sided trigger points of the rhomboid and trapezius was injected using 1 cc's of 40 mg Depo-Medrol and 4 cc's of 0.25% bupivacaine with a 25 1" needle.  Ultrasound was used.     A sterile dressing was applied.  Patient did tolerate procedure well.      ASSESSMENT & PLAN:   Myofascial pain syndrome Recent MRI of the right shoulder demonstrated no shoulder pathology.  Did seem to have some edema surrounding the scapula. -Trigger point injections today.

## 2020-06-02 ENCOUNTER — Ambulatory Visit: Payer: BC Managed Care – PPO | Admitting: Family Medicine

## 2020-06-29 ENCOUNTER — Ambulatory Visit (INDEPENDENT_AMBULATORY_CARE_PROVIDER_SITE_OTHER): Payer: BC Managed Care – PPO | Admitting: Family Medicine

## 2020-06-29 ENCOUNTER — Other Ambulatory Visit: Payer: Self-pay

## 2020-06-29 ENCOUNTER — Encounter: Payer: Self-pay | Admitting: Family Medicine

## 2020-06-29 VITALS — BP 100/60 | Ht 69.0 in | Wt 140.0 lb

## 2020-06-29 DIAGNOSIS — M357 Hypermobility syndrome: Secondary | ICD-10-CM | POA: Diagnosis not present

## 2020-06-29 DIAGNOSIS — M7918 Myalgia, other site: Secondary | ICD-10-CM | POA: Diagnosis not present

## 2020-06-29 MED ORDER — GABAPENTIN 100 MG PO CAPS: 100.0000 mg | ORAL_CAPSULE | Freq: Three times a day (TID) | ORAL | 3 refills | Status: DC

## 2020-06-29 NOTE — Assessment & Plan Note (Signed)
Seems to be associated with some of her ongoing pain.  She has had nerve studies in the past that are unrevealing.  Possibility that she may have thoracic outlet down the right side as to the symptoms she is experiencing. -Counseled on home exercise therapy and supportive care. -Gabapentin.

## 2020-06-29 NOTE — Patient Instructions (Signed)
Good to see you Please try the gabapentin at night initially. You can increase to 2 or 3 times daily as you tolerate.   Please send me a message in MyChart with any questions or updates.  Please see me back in 4 weeks.   --Dr. Jordan Likes

## 2020-06-29 NOTE — Progress Notes (Signed)
°  Autumn Ray - 40 y.o. female MRN [id number]  Date of birth: 1980-07-27  SUBJECTIVE:  Including CC & ROS.  No chief complaint on file.   Autumn Ray is a 40 y.o. female that is presenting with ongoing pain in her neck and right arm.  We have tried medications and trigger point injections with limited improvement.  She had some improvement with physical therapy and dry needling.  Her pain is still ongoing and intermittent.   Review of Systems See HPI   HISTORY: Past Medical, Surgical, Social, and Family History Reviewed & Updated per EMR.   Pertinent Historical Findings include:  No past medical history on file.  Past Surgical History:  Procedure Laterality Date   ABDOMINAL HYSTERECTOMY     EYE SURGERY     FRACTURE SURGERY      Family History  Problem Relation Age of Onset   Heart failure Mother    Stroke Mother    Hyperlipidemia Mother    COPD Mother    Hypertension Sister    Hyperlipidemia Sister    Diabetes Sister     Social History   Socioeconomic History   Marital status: Single    Spouse name: Not on file   Number of children: Not on file   Years of education: Not on file   Highest education level: Not on file  Occupational History   Not on file  Tobacco Use   Smoking status: Former Smoker    Quit date: 04/09/2008    Years since quitting: 12.2   Smokeless tobacco: Never Used  Building services engineer Use: Every day  Substance and Sexual Activity   Alcohol use: Not Currently   Drug use: Not on file   Sexual activity: Not on file  Other Topics Concern   Not on file  Social History Narrative   Not on file   Social Determinants of Health   Financial Resource Strain: Not on file  Food Insecurity: Not on file  Transportation Needs: Not on file  Physical Activity: Not on file  Stress: Not on file  Social Connections: Not on file  Intimate Partner Violence: Not on file     PHYSICAL EXAM:  VS: BP 100/60 (BP Location: Left Arm,  Patient Position: Sitting, Cuff Size: Normal)    Ht 5\' 9"  (1.753 m)    Wt 140 lb (63.5 kg)    BMI 20.67 kg/m  Physical Exam Gen: NAD, alert, cooperative with exam, well-appearing    ASSESSMENT & PLAN:   Hypermobility syndrome Seems to be associated with some of her ongoing pain.  She has had nerve studies in the past that are unrevealing.  Possibility that she may have thoracic outlet down the right side as to the symptoms she is experiencing. -Counseled on home exercise therapy and supportive care. -Gabapentin.   Myofascial pain syndrome She received minor improvement with trigger point injections.  Did get more improvement with the dry needling. -Counseled on home exercise therapy and supportive care. -Could consider a nerve study

## 2020-06-29 NOTE — Assessment & Plan Note (Signed)
She received minor improvement with trigger point injections.  Did get more improvement with the dry needling. -Counseled on home exercise therapy and supportive care. -Could consider a nerve study

## 2020-07-14 ENCOUNTER — Ambulatory Visit (INDEPENDENT_AMBULATORY_CARE_PROVIDER_SITE_OTHER): Payer: BC Managed Care – PPO | Admitting: Family Medicine

## 2020-07-14 ENCOUNTER — Other Ambulatory Visit: Payer: Self-pay

## 2020-07-14 ENCOUNTER — Encounter: Payer: Self-pay | Admitting: Family Medicine

## 2020-07-14 DIAGNOSIS — M7918 Myalgia, other site: Secondary | ICD-10-CM

## 2020-07-14 MED ORDER — NORTRIPTYLINE HCL 10 MG PO CAPS: 10.0000 mg | ORAL_CAPSULE | Freq: Every day | ORAL | 1 refills | Status: DC

## 2020-07-14 NOTE — Assessment & Plan Note (Signed)
Continues to have an exacerbation of upper thoracic and periscapular pain on the right.  Imaging has been negative of her right shoulder.  Little to no improvement with gabapentin.  Is exacerbated with activity she does not work.  Hypermobility is likely playing a part. -Counseled on home exercise therapy and supportive care. -Discontinue gabapentin.  Counseled on weaning off. -Initiate nortriptyline. -Completed FMLA.

## 2020-07-14 NOTE — Patient Instructions (Signed)
Good to see you Please slowly wean off of the gabapentin.  Please start the new medication before bed  Please send me a message in MyChart with any questions or updates.  Please see me back in 4 weeks.   --Dr. Jordan Likes

## 2020-07-14 NOTE — Progress Notes (Signed)
  Autumn Ray - 40 y.o. female MRN [id number]  Date of birth: [birth year]  SUBJECTIVE:  Including CC & ROS.  No chief complaint on file.   Autumn Ray is a 40 y.o. female that is following up for her ongoing back pain and periscapular pain.  She has not gotten any improvement with the gabapentin.  She continues to get flares at work..   Review of Systems See HPI   HISTORY: Past Medical, Surgical, Social, and Family History Reviewed & Updated per EMR.   Pertinent Historical Findings include:  No past medical history on file.  Past Surgical History:  Procedure Laterality Date  . ABDOMINAL HYSTERECTOMY    . EYE SURGERY    . FRACTURE SURGERY      Family History  Problem Relation Age of Onset  . Heart failure Mother   . Stroke Mother   . Hyperlipidemia Mother   . COPD Mother   . Hypertension Sister   . Hyperlipidemia Sister   . Diabetes Sister     Social History   Socioeconomic History  . Marital status: Single    Spouse name: Not on file  . Number of children: Not on file  . Years of education: Not on file  . Highest education level: Not on file  Occupational History  . Not on file  Tobacco Use  . Smoking status: Former Smoker    Quit date: 04/09/2008    Years since quitting: 12.2  . Smokeless tobacco: Never Used  Vaping Use  . Vaping Use: Every day  Substance and Sexual Activity  . Alcohol use: Not Currently  . Drug use: Not on file  . Sexual activity: Not on file  Other Topics Concern  . Not on file  Social History Narrative  . Not on file   Social Determinants of Health   Financial Resource Strain: Not on file  Food Insecurity: Not on file  Transportation Needs: Not on file  Physical Activity: Not on file  Stress: Not on file  Social Connections: Not on file  Intimate Partner Violence: Not on file     PHYSICAL EXAM:  VS: BP 102/78 (BP Location: Left Arm, Patient Position: Sitting, Cuff Size: Normal)   Ht 5\' 9"  (1.753 m)   Wt 140 lb (63.5 kg)    BMI 20.67 kg/m  Physical Exam Gen: NAD, alert, cooperative with exam, well-appearing    ASSESSMENT & PLAN:   Myofascial pain syndrome Continues to have an exacerbation of upper thoracic and periscapular pain on the right.  Imaging has been negative of her right shoulder.  Little to no improvement with gabapentin.  Is exacerbated with activity she does not work.  Hypermobility is likely playing a part. -Counseled on home exercise therapy and supportive care. -Discontinue gabapentin.  Counseled on weaning off. -Initiate nortriptyline. -Completed FMLA.

## 2020-08-09 ENCOUNTER — Other Ambulatory Visit: Payer: Self-pay | Admitting: Family Medicine

## 2020-08-12 ENCOUNTER — Encounter: Payer: Self-pay | Admitting: Family Medicine

## 2020-08-12 ENCOUNTER — Other Ambulatory Visit: Payer: Self-pay

## 2020-08-12 ENCOUNTER — Ambulatory Visit (INDEPENDENT_AMBULATORY_CARE_PROVIDER_SITE_OTHER): Payer: BC Managed Care – PPO | Admitting: Family Medicine

## 2020-08-12 VITALS — BP 102/70 | Ht 69.0 in | Wt 140.0 lb

## 2020-08-12 DIAGNOSIS — G540 Brachial plexus disorders: Secondary | ICD-10-CM | POA: Diagnosis not present

## 2020-08-12 NOTE — Progress Notes (Signed)
  Autumn Ray - 40 y.o. female MRN [id number]  Date of birth: 1981/04/01  SUBJECTIVE:  Including CC & ROS.  No chief complaint on file.   Autumn Ray is a 40 y.o. female that is following up for her right arm pain.  She is got improvement with the nortriptyline for her neck and back pain.  The right arm still has intermittent episodes of paresthesia and pain.   Review of Systems See HPI   HISTORY: Past Medical, Surgical, Social, and Family History Reviewed & Updated per EMR.   Pertinent Historical Findings include:  History reviewed. No pertinent past medical history.  Past Surgical History:  Procedure Laterality Date  . ABDOMINAL HYSTERECTOMY    . EYE SURGERY    . FRACTURE SURGERY      Family History  Problem Relation Age of Onset  . Heart failure Mother   . Stroke Mother   . Hyperlipidemia Mother   . COPD Mother   . Hypertension Sister   . Hyperlipidemia Sister   . Diabetes Sister     Social History   Socioeconomic History  . Marital status: Single    Spouse name: Not on file  . Number of children: Not on file  . Years of education: Not on file  . Highest education level: Not on file  Occupational History  . Not on file  Tobacco Use  . Smoking status: Former Smoker    Quit date: 04/09/2008    Years since quitting: 12.3  . Smokeless tobacco: Never Used  Vaping Use  . Vaping Use: Every day  Substance and Sexual Activity  . Alcohol use: Not Currently  . Drug use: Not on file  . Sexual activity: Not on file  Other Topics Concern  . Not on file  Social History Narrative  . Not on file   Social Determinants of Health   Financial Resource Strain: Not on file  Food Insecurity: Not on file  Transportation Needs: Not on file  Physical Activity: Not on file  Stress: Not on file  Social Connections: Not on file  Intimate Partner Violence: Not on file     PHYSICAL EXAM:  VS: BP 102/70 (BP Location: Right Arm, Patient Position: Sitting, Cuff Size: Normal)    Ht 5\' 9"  (1.753 m)   Wt 140 lb (63.5 kg)   BMI 20.67 kg/m  Physical Exam Gen: NAD, alert, cooperative with exam, well-appearing    ASSESSMENT & PLAN:   Thoracic outlet syndrome Concern given her symptoms that it is more related to thoracic outlet on the right side.  Imaging has been negative thus far. -Ultrasound to pursue thoracic outlet syndrome.

## 2020-08-12 NOTE — Patient Instructions (Signed)
Good to see you We'll call to let you know about the ultrasound  Please continue the medicine   Please send me a message in MyChart with any questions or updates.  We'll do a virtual visit once the results are in for the ultrasound.   --Dr. Jordan Likes

## 2020-08-12 NOTE — Assessment & Plan Note (Signed)
Concern given her symptoms that it is more related to thoracic outlet on the right side.  Imaging has been negative thus far. -Ultrasound to pursue thoracic outlet syndrome.

## 2020-08-18 ENCOUNTER — Other Ambulatory Visit: Payer: Self-pay | Admitting: *Deleted

## 2020-08-18 MED ORDER — NORTRIPTYLINE HCL 10 MG PO CAPS: 10.0000 mg | ORAL_CAPSULE | Freq: Every day | ORAL | 1 refills | Status: DC

## 2020-08-19 ENCOUNTER — Other Ambulatory Visit: Payer: Self-pay

## 2020-08-19 ENCOUNTER — Other Ambulatory Visit: Payer: Self-pay | Admitting: Family Medicine

## 2020-08-19 ENCOUNTER — Ambulatory Visit (HOSPITAL_COMMUNITY)
Admission: RE | Admit: 2020-08-19 | Discharge: 2020-08-19 | Disposition: A | Discharge status: Home / Self Care | Payer: BC Managed Care – PPO | Source: Ambulatory Visit | Attending: Family Medicine | Admitting: Family Medicine

## 2020-08-19 DIAGNOSIS — G540 Brachial plexus disorders: Secondary | ICD-10-CM | POA: Diagnosis present

## 2020-08-19 MED ORDER — NORTRIPTYLINE HCL 10 MG PO CAPS: 10.0000 mg | ORAL_CAPSULE | Freq: Every day | ORAL | 1 refills | Status: DC

## 2020-08-19 NOTE — Progress Notes (Signed)
Refilled nortriptyline  Myra Rude, MD [facility] 08/19/2020, 8:12 AM

## 2020-08-23 ENCOUNTER — Telehealth: Payer: Self-pay | Admitting: Family Medicine

## 2020-08-23 NOTE — Telephone Encounter (Signed)
Informed of results.   Myra Rude, MD [facility] 08/23/2020, 9:27 AM

## 2020-11-01 ENCOUNTER — Ambulatory Visit (INDEPENDENT_AMBULATORY_CARE_PROVIDER_SITE_OTHER): Payer: BC Managed Care – PPO | Admitting: Family Medicine

## 2020-11-01 ENCOUNTER — Other Ambulatory Visit: Payer: Self-pay

## 2020-11-01 DIAGNOSIS — M7918 Myalgia, other site: Secondary | ICD-10-CM | POA: Diagnosis not present

## 2020-11-01 NOTE — Progress Notes (Signed)
  Autumn Ray - 40 y.o. female MRN [id number]  Date of birth: Nov 15, 1980  SUBJECTIVE:  Including CC & ROS.  Chief Complaint  Patient presents with   Follow-up    Shoulder and neck pain. States the shoulder R is getting worse more sore and patient is experiencing blurry vision and dizziness. Does have an eye doctor appointment.     Autumn Ray is a 40 y.o. female that is presenting with neck and back pain.  The pain is gotten worse over the past few weeks.  Similar to pain that she has experienced before.  She is also having changes in vision on the right side and does have an eye appointment scheduled    Review of Systems See HPI   HISTORY: Past Medical, Surgical, Social, and Family History Reviewed & Updated per EMR.   Pertinent Historical Findings include:  No past medical history on file.  Past Surgical History:  Procedure Laterality Date   ABDOMINAL HYSTERECTOMY     EYE SURGERY     FRACTURE SURGERY      Family History  Problem Relation Age of Onset   Heart failure Mother    Stroke Mother    Hyperlipidemia Mother    COPD Mother    Hypertension Sister    Hyperlipidemia Sister    Diabetes Sister     Social History   Socioeconomic History   Marital status: Single    Spouse name: Not on file   Number of children: Not on file   Years of education: Not on file   Highest education level: Not on file  Occupational History   Not on file  Tobacco Use   Smoking status: Former    Types: Cigarettes    Quit date: 04/09/2008    Years since quitting: 12.5   Smokeless tobacco: Never  Vaping Use   Vaping Use: Every day  Substance and Sexual Activity   Alcohol use: Not Currently   Drug use: Not on file   Sexual activity: Not on file  Other Topics Concern   Not on file  Social History Narrative   Not on file   Social Determinants of Health   Financial Resource Strain: Not on file  Food Insecurity: Not on file  Transportation Needs: Not on file  Physical Activity:  Not on file  Stress: Not on file  Social Connections: Not on file  Intimate Partner Violence: Not on file     PHYSICAL EXAM:  VS: BP 100/70   Pulse 86   Ht 5\' 9"  (1.753 m)   Wt 134 lb (60.8 kg)   SpO2 99%   BMI 19.79 kg/m  Physical Exam Gen: NAD, alert, cooperative with exam, well-appearing       ASSESSMENT & PLAN:   Myofascial pain syndrome Pain today seems to be related to her hypermobility and instability of the neck as she has the pain at the neck as well as periscapular. -Counseled on home exercise therapy and supportive care. -Could consider further imaging. -She will continue nortriptyline for now. -Could consider prednisone.

## 2020-11-02 NOTE — Assessment & Plan Note (Signed)
Pain today seems to be related to her hypermobility and instability of the neck as she has the pain at the neck as well as periscapular. -Counseled on home exercise therapy and supportive care. -Could consider further imaging. -She will continue nortriptyline for now. -Could consider prednisone.

## 2020-12-06 ENCOUNTER — Telehealth: Payer: Self-pay | Admitting: Family Medicine

## 2020-12-06 NOTE — Telephone Encounter (Signed)
Spoke with patient.   Myra Rude, MD [facility] 12/06/2020, 2:48 PM

## 2020-12-06 NOTE — Telephone Encounter (Signed)
Patient called req'd to speak w/ provider to ask if he will order the MRI of her neck & spine--advised in clinic & would forward her message to Dr. Jordan Likes -- Explained Precert process for MRI & that our Radioloy would contact her once approval rcvd from [employer] for svc.  --Forwarding request to Dr. Jordan Likes.  --glh

## 2020-12-08 ENCOUNTER — Other Ambulatory Visit: Payer: Self-pay

## 2020-12-08 ENCOUNTER — Ambulatory Visit (HOSPITAL_BASED_OUTPATIENT_CLINIC_OR_DEPARTMENT_OTHER)
Admission: RE | Admit: 2020-12-08 | Discharge: 2020-12-08 | Disposition: A | Discharge status: Home / Self Care | Payer: BC Managed Care – PPO | Source: Ambulatory Visit | Attending: Family Medicine | Admitting: Family Medicine

## 2020-12-08 ENCOUNTER — Ambulatory Visit (INDEPENDENT_AMBULATORY_CARE_PROVIDER_SITE_OTHER): Payer: BC Managed Care – PPO | Admitting: Family Medicine

## 2020-12-08 ENCOUNTER — Encounter: Payer: Self-pay | Admitting: Family Medicine

## 2020-12-08 VITALS — Ht 69.0 in | Wt 130.0 lb

## 2020-12-08 DIAGNOSIS — M5412 Radiculopathy, cervical region: Secondary | ICD-10-CM | POA: Insufficient documentation

## 2020-12-08 MED ORDER — METHYLPREDNISOLONE ACETATE 40 MG/ML IJ SUSP
40.0000 mg | Freq: Once | INTRAMUSCULAR | Status: AC
  Administered 2020-12-08: 40 mg via INTRAMUSCULAR

## 2020-12-08 MED ORDER — KETOROLAC TROMETHAMINE 30 MG/ML IJ SOLN
30.0000 mg | Freq: Once | INTRAMUSCULAR | Status: AC
  Administered 2020-12-08: 30 mg via INTRAMUSCULAR

## 2020-12-08 NOTE — Patient Instructions (Signed)
Good to see you Please check out the body braid  I will call with the xray results.  [facility] do an [facility]   Please send me a message in MyChart with any questions or updates.  We will schedule a virtual visit once the MRI is resulted.   --Dr. Jordan Likes

## 2020-12-08 NOTE — Progress Notes (Signed)
  Autumn Ray - 40 y.o. female MRN [id number]  Date of birth: Jul 05, 1980  SUBJECTIVE:  Including CC & ROS.  No chief complaint on file.   Autumn Ray is a 40 y.o. female that is presenting with acute on chronic radicular type pain.  She has pain in the posterior aspect of the neck as well as pain in the left and right arm.  She has tried medications, physical therapy and injections.  Pain is acutely worsened over the past week and a half.   Review of Systems See HPI   HISTORY: Past Medical, Surgical, Social, and Family History Reviewed & Updated per EMR.   Pertinent Historical Findings include:  History reviewed. No pertinent past medical history.  Past Surgical History:  Procedure Laterality Date   ABDOMINAL HYSTERECTOMY     EYE SURGERY     FRACTURE SURGERY      Family History  Problem Relation Age of Onset   Heart failure Mother    Stroke Mother    Hyperlipidemia Mother    COPD Mother    Hypertension Sister    Hyperlipidemia Sister    Diabetes Sister     Social History   Socioeconomic History   Marital status: Single    Spouse name: Not on file   Number of children: Not on file   Years of education: Not on file   Highest education level: Not on file  Occupational History   Not on file  Tobacco Use   Smoking status: Former    Types: Cigarettes    Quit date: 04/09/2008    Years since quitting: 12.6   Smokeless tobacco: Never  Vaping Use   Vaping Use: Every day  Substance and Sexual Activity   Alcohol use: Not Currently   Drug use: Not on file   Sexual activity: Not on file  Other Topics Concern   Not on file  Social History Narrative   Not on file   Social Determinants of Health   Financial Resource Strain: Not on file  Food Insecurity: Not on file  Transportation Needs: Not on file  Physical Activity: Not on file  Stress: Not on file  Social Connections: Not on file  Intimate Partner Violence: Not on file     PHYSICAL EXAM:  VS: Ht 5\' 9"  (1.753  m)   Wt 130 lb (59 kg)   BMI 19.20 kg/m  Physical Exam Gen: NAD, alert, cooperative with exam, well-appearing MSK:  Neck: Normal range of motion. Positive Spurling's test. Weakness in grip strength in the left hand. Neurovascular intact     ASSESSMENT & PLAN:   Cervical radiculopathy Acute on chronic in nature.  Has radicular type pain in her upper extremities as well as weakness with grip strength.  She has tried greater than 6 weeks of home exercise therapy and tried physical therapy. -Counseled on home exercise therapy and supportive care. -IM Toradol and Depo-Medrol. -X-ray. -MRI of the cervical spine to evaluate for nerve impingement for the consideration of epidural use.

## 2020-12-08 NOTE — Assessment & Plan Note (Signed)
Acute on chronic in nature.  Has radicular type pain in her upper extremities as well as weakness with grip strength.  She has tried greater than 6 weeks of home exercise therapy and tried physical therapy. -Counseled on home exercise therapy and supportive care. -IM Toradol and Depo-Medrol. -X-ray. -MRI of the cervical spine to evaluate for nerve impingement for the consideration of epidural use.

## 2020-12-08 NOTE — Addendum Note (Signed)
Addended by: Merrilyn Puma on: 12/08/2020 10:56 AM   Modules accepted: Orders

## 2020-12-13 ENCOUNTER — Telehealth: Payer: Self-pay | Admitting: Family Medicine

## 2020-12-13 NOTE — Telephone Encounter (Signed)
Informed of results.   Myra Rude, MD [facility] 12/13/2020, 8:15 AM

## 2020-12-24 ENCOUNTER — Other Ambulatory Visit: Payer: Self-pay

## 2020-12-24 ENCOUNTER — Ambulatory Visit (INDEPENDENT_AMBULATORY_CARE_PROVIDER_SITE_OTHER): Payer: BC Managed Care – PPO

## 2020-12-24 DIAGNOSIS — M5412 Radiculopathy, cervical region: Secondary | ICD-10-CM | POA: Diagnosis not present

## 2020-12-24 DIAGNOSIS — M542 Cervicalgia: Secondary | ICD-10-CM

## 2020-12-26 ENCOUNTER — Encounter: Payer: Self-pay | Admitting: Family Medicine

## 2020-12-26 ENCOUNTER — Other Ambulatory Visit: Payer: Self-pay

## 2020-12-26 ENCOUNTER — Telehealth (INDEPENDENT_AMBULATORY_CARE_PROVIDER_SITE_OTHER): Payer: BC Managed Care – PPO | Admitting: Family Medicine

## 2020-12-26 VITALS — Ht 69.0 in | Wt 130.0 lb

## 2020-12-26 DIAGNOSIS — M5412 Radiculopathy, cervical region: Secondary | ICD-10-CM

## 2020-12-26 NOTE — Progress Notes (Signed)
Virtual Visit via Video Note  I connected with Autumn Ray on 12/26/20 at 11:10 AM EDT by a video enabled telemedicine application and verified that I am speaking with the correct person using two identifiers.  Location: Patient: vehicle Provider: office   I discussed the limitations of evaluation and management by telemedicine and the availability of in person appointments. The patient expressed understanding and agreed to proceed.  History of Present Illness:  Autumn Ray is a 40 year old female that is following up after the MRI of her cervical spine.  This was revealing for what appears to be noncompressive disc bulging at C3-4 and C4-5 without stenosis or impingement.   Observations/Objective:   Assessment and Plan:  Cervical radiculopathy: MRI was revealing for noncompressive disc bulging but she is still presenting with radicular type pain.   -Counseled on home exercise therapy and supportive care. -Pursue epidural to see if she gets improvement.   Follow Up Instructions:    I discussed the assessment and treatment plan with the patient. The patient was provided an opportunity to ask questions and all were answered. The patient agreed with the plan and demonstrated an understanding of the instructions.   The patient was advised to call back or seek an in-person evaluation if the symptoms worsen or if the condition fails to improve as anticipated.    Clare Gandy, MD

## 2020-12-27 NOTE — Assessment & Plan Note (Signed)
MRI was revealing for noncompressive disc bulging but she is still presenting with radicular type pain.   -Counseled on home exercise therapy and supportive care. -Pursue epidural to see if she gets improvement.

## 2020-12-30 ENCOUNTER — Other Ambulatory Visit: Payer: Self-pay | Admitting: Family Medicine

## 2020-12-30 NOTE — Progress Notes (Signed)
Has anaphylaxis to contrast.  She will contemplate pursuing the epidural versus avoiding it and obtaining a nerve study of the upper extremity.  Myra Rude, MD [facility] 12/30/2020, 10:03 AM

## 2021-01-23 ENCOUNTER — Telehealth: Payer: Self-pay | Admitting: Family Medicine

## 2021-01-23 ENCOUNTER — Encounter: Payer: Self-pay | Admitting: Family Medicine

## 2021-01-23 ENCOUNTER — Ambulatory Visit (INDEPENDENT_AMBULATORY_CARE_PROVIDER_SITE_OTHER): Payer: BC Managed Care – PPO | Admitting: Family Medicine

## 2021-01-23 VITALS — Ht 69.0 in | Wt 130.0 lb

## 2021-01-23 DIAGNOSIS — T671XXA Heat syncope, initial encounter: Secondary | ICD-10-CM

## 2021-01-23 DIAGNOSIS — G90A Postural orthostatic tachycardia syndrome (POTS): Secondary | ICD-10-CM | POA: Diagnosis not present

## 2021-01-23 MED ORDER — PROPRANOLOL HCL 20 MG PO TABS: 20.0000 mg | ORAL_TABLET | Freq: Every day | ORAL | 1 refills | Status: DC

## 2021-01-23 NOTE — Assessment & Plan Note (Signed)
Has fluctuations from tachycardic to a normal heart rate throughout the course of the day.  Occurs with greater than 30 beats of a range. -Counseled on home exercise therapy and supportive care. -Echo. -Propranolol.

## 2021-01-23 NOTE — Assessment & Plan Note (Signed)
Likely related to her POTS -CBC, iron and ferritin, CMP, magnesium, TSH. -Could consider referral to cardiology.

## 2021-01-23 NOTE — Telephone Encounter (Signed)
I spoke to a rep at [facility] and he states the plan does not require precert for pt's Echo because it is preventative. He states if her deductible has been met, her Echo is covered at 100%. If it's not met, her plan covers the test at 75%. I informed pt of this and she states she knows she has met her deductible and wants to proceed with the Echo.   [employer] case #: [id number]

## 2021-01-23 NOTE — Patient Instructions (Signed)
Good to see you I will call with the lab results  Please try the propranolol and let me know how you feel.   Please send me a message in MyChart with any questions or updates.  I'll call with the ECHo results. .   --Dr. Jordan Likes

## 2021-01-23 NOTE — Progress Notes (Signed)
  Autumn Ray - 40 y.o. female MRN [id number]  Date of birth: Mar 06, 1981  SUBJECTIVE:  Including CC & ROS.  No chief complaint on file.   Autumn Ray is a 40 y.o. female that is presenting with acute syncope on Thursday and Friday.  Has had episodes intermittently over the course of several years.  She was cooking in the kitchen with no new or different activities.  Has had several instances in the past.  She experienced severe sweating when it occurred.  He has significant alterations in her heart rate based on her apple watch.   Review of Systems See HPI   HISTORY: Past Medical, Surgical, Social, and Family History Reviewed & Updated per EMR.   Pertinent Historical Findings include:  History reviewed. No pertinent past medical history.  Past Surgical History:  Procedure Laterality Date   ABDOMINAL HYSTERECTOMY     EYE SURGERY     FRACTURE SURGERY      Family History  Problem Relation Age of Onset   Heart failure Mother    Stroke Mother    Hyperlipidemia Mother    COPD Mother    Hypertension Sister    Hyperlipidemia Sister    Diabetes Sister     Social History   Socioeconomic History   Marital status: Single    Spouse name: Not on file   Number of children: Not on file   Years of education: Not on file   Highest education level: Not on file  Occupational History   Not on file  Tobacco Use   Smoking status: Former    Types: Cigarettes    Quit date: 04/09/2008    Years since quitting: 12.8   Smokeless tobacco: Never  Vaping Use   Vaping Use: Every day  Substance and Sexual Activity   Alcohol use: Not Currently   Drug use: Not on file   Sexual activity: Not on file  Other Topics Concern   Not on file  Social History Narrative   Not on file   Social Determinants of Health   Financial Resource Strain: Not on file  Food Insecurity: Not on file  Transportation Needs: Not on file  Physical Activity: Not on file  Stress: Not on file  Social Connections: Not  on file  Intimate Partner Violence: Not on file     PHYSICAL EXAM:  VS: Ht 5\' 9"  (1.753 m)   Wt 130 lb (59 kg)   BMI 19.20 kg/m  Physical Exam Gen: NAD, alert, cooperative with exam, well-appearing      ASSESSMENT & PLAN:   POTS (postural orthostatic tachycardia syndrome) Has fluctuations from tachycardic to a normal heart rate throughout the course of the day.  Occurs with greater than 30 beats of a range. -Counseled on home exercise therapy and supportive care. -Echo. -Propranolol.  Heat syncope Likely related to her POTS -CBC, iron and ferritin, CMP, magnesium, TSH. -Could consider referral to cardiology.

## 2021-01-23 NOTE — Telephone Encounter (Signed)
[facility] lab rep says pt's Ins [employer] requires Pre-certification-- Lab request the authorization#.  --Forwarding message to med asst for review 7 to contact them @ [phone].  --glh

## 2021-01-24 LAB — COMPREHENSIVE METABOLIC PANEL
ALT: 13 IU/L (ref 0–32)
AST: 16 IU/L (ref 0–40)
Albumin/Globulin Ratio: 2.4 — ABNORMAL HIGH (ref 1.2–2.2)
Albumin: 4.6 g/dL (ref 3.8–4.8)
Alkaline Phosphatase: 51 IU/L (ref 44–121)
BUN/Creatinine Ratio: 7 — ABNORMAL LOW (ref 9–23)
BUN: 5 mg/dL — ABNORMAL LOW (ref 6–24)
Bilirubin Total: 0.3 mg/dL (ref 0.0–1.2)
CO2: 26 mmol/L (ref 20–29)
Calcium: 9.4 mg/dL (ref 8.7–10.2)
Chloride: 100 mmol/L (ref 96–106)
Creatinine, Ser: 0.67 mg/dL (ref 0.57–1.00)
Globulin, Total: 1.9 g/dL (ref 1.5–4.5)
Glucose: 112 mg/dL — ABNORMAL HIGH (ref 70–99)
Potassium: 4.4 mmol/L (ref 3.5–5.2)
Sodium: 141 mmol/L (ref 134–144)
Total Protein: 6.5 g/dL (ref 6.0–8.5)
eGFR: 113 mL/min/{1.73_m2} (ref >59)

## 2021-01-24 LAB — CBC
Hematocrit: 43.7 % (ref 34.0–46.6)
Hemoglobin: 14.5 g/dL (ref 11.1–15.9)
MCH: 32.7 pg (ref 26.6–33.0)
MCHC: 33.2 g/dL (ref 31.5–35.7)
MCV: 99 fL — ABNORMAL HIGH (ref 79–97)
Platelets: 273 10*3/uL (ref 150–450)
RBC: 4.43 x10E6/uL (ref 3.77–5.28)
RDW: 12.1 % (ref 11.7–15.4)
WBC: 7.5 10*3/uL (ref 3.4–10.8)

## 2021-01-24 LAB — IRON,TIBC AND FERRITIN PANEL
Ferritin: 57 ng/mL (ref 15–150)
Iron Saturation: 22 % (ref 15–55)
Iron: 75 ug/dL (ref 27–159)
Total Iron Binding Capacity: 341 ug/dL (ref 250–450)
UIBC: 266 ug/dL (ref 131–425)

## 2021-01-24 LAB — MAGNESIUM: Magnesium: 1.9 mg/dL (ref 1.6–2.3)

## 2021-01-24 LAB — TSH: TSH: 1.99 u[IU]/mL (ref 0.450–4.500)

## 2021-01-24 NOTE — Telephone Encounter (Signed)
Left detailed message informing Misty Stanley at [facility] lab of below.

## 2021-01-25 ENCOUNTER — Telehealth: Payer: Self-pay | Admitting: Family Medicine

## 2021-01-25 NOTE — Telephone Encounter (Signed)
Informed of results.   Myra Rude, MD [facility] 01/25/2021, 8:11 AM

## 2021-02-02 ENCOUNTER — Other Ambulatory Visit: Payer: Self-pay

## 2021-02-02 ENCOUNTER — Ambulatory Visit (HOSPITAL_COMMUNITY): Payer: BC Managed Care – PPO | Attending: Internal Medicine

## 2021-02-02 DIAGNOSIS — T671XXA Heat syncope, initial encounter: Secondary | ICD-10-CM | POA: Diagnosis not present

## 2021-02-02 DIAGNOSIS — G90A Postural orthostatic tachycardia syndrome (POTS): Secondary | ICD-10-CM | POA: Diagnosis present

## 2021-02-02 LAB — ECHOCARDIOGRAM COMPLETE
Area-P 1/2: 3.16 cm2
S' Lateral: 3.1 cm

## 2021-02-03 ENCOUNTER — Telehealth: Payer: Self-pay | Admitting: Family Medicine

## 2021-02-03 DIAGNOSIS — G90A Postural orthostatic tachycardia syndrome (POTS): Secondary | ICD-10-CM

## 2021-02-03 NOTE — Telephone Encounter (Signed)
Informed of results.  No improvement with propranolol.  We will make referral to cardiology.  Myra Rude, MD [facility] 02/03/2021, 12:36 PM

## 2021-02-07 ENCOUNTER — Ambulatory Visit: Payer: BC Managed Care – PPO | Admitting: Internal Medicine

## 2021-03-01 ENCOUNTER — Encounter: Payer: Self-pay | Admitting: Cardiology

## 2021-03-01 ENCOUNTER — Other Ambulatory Visit: Payer: Self-pay

## 2021-03-01 ENCOUNTER — Ambulatory Visit (INDEPENDENT_AMBULATORY_CARE_PROVIDER_SITE_OTHER): Payer: BC Managed Care – PPO | Admitting: Cardiology

## 2021-03-01 VITALS — Ht 69.0 in | Wt 136.4 lb

## 2021-03-01 DIAGNOSIS — R55 Syncope and collapse: Secondary | ICD-10-CM | POA: Diagnosis not present

## 2021-03-01 NOTE — Patient Instructions (Addendum)
Medication Instructions:  Your physician recommends that you continue on your current medications as directed. Please refer to the Current Medication list given to you today. *If you need a refill on your cardiac medications before your next appointment, please call your pharmacy*  Lab Work: None ordered. If you have labs (blood work) drawn today and your tests are completely normal, you will receive your results only by: MyChart Message (if you have MyChart) OR A paper copy in the mail If you have any lab test that is abnormal or we need to change your treatment, we will call you to review the results.  Testing/Procedures: None ordered.  Follow-Up: At [facility], you and your health needs are our priority.  As part of our continuing mission to provide you with exceptional heart care, we have created designated Provider Care Teams.  These Care Teams include your primary Cardiologist (physician) and Advanced Practice Providers (APPs -  Physician Assistants and Nurse Practitioners) who all work together to provide you with the care you need, when you need it.  Your next appointment:   Your physician wants you to follow-up in: as needed   

## 2021-03-01 NOTE — Progress Notes (Signed)
Electrophysiology Office Note:    Date:  03/01/2021   ID:  Autumn Ray, DOB Dec 08, 1980, MRN [id number]  PCP:  Patient, No Pcp Per (Inactive)  [facility] Cardiologist:  None  [facility] Electrophysiologist:  None   Referring MD: Rosemarie Ax, MD   Chief Complaint: Syncope  History of Present Illness:    Autumn Ray is a 40 y.o. female who presents for an evaluation of syncope at the request of Dr. Raeford Razor.  The patient tells me that since she was in her teens she has suffered with lightheadedness and passing out spells.  She thinks she has passed at least 10 times.  The most recent 2 episodes were on October 13 and 14.  She tells me that each episode of near syncope or syncope is very similar.  Episodes start with her breaking out into a cold sweat.  She frequently has nausea.  Her vision also frequently changes with loss of peripheral vision and seeing spots.  The visual disturbance eventually progresses to complete darkness and she will lose consciousness.  If she is able to sit down during the early sequence of events, she can usually avoid a complete loss of consciousness.  For 10 to 15 minutes after a loss of consciousness episode she feels poorly but then eventually returns to her normal state of health. She also tells me that she frequently will get nauseous or lightheaded when she has vaccines or blood draws.      History reviewed. No pertinent past medical history.  Past Surgical History:  Procedure Laterality Date   ABDOMINAL HYSTERECTOMY     EYE SURGERY     FRACTURE SURGERY      Current Medications: Current Meds  Medication Sig   cyclobenzaprine (FLEXERIL) 10 MG tablet Take 0.5 tablets (5 mg total) by mouth 3 (three) times daily as needed.   Naproxen Sodium 220 MG CAPS Take by mouth.   nortriptyline (PAMELOR) 10 MG capsule Take 1 capsule (10 mg total) by mouth at bedtime.     Allergies:   Iodinated diagnostic agents, Doxycycline, and Latex   Social  History   Socioeconomic History   Marital status: Single    Spouse name: Not on file   Number of children: Not on file   Years of education: Not on file   Highest education level: Not on file  Occupational History   Not on file  Tobacco Use   Smoking status: Former    Types: Cigarettes    Quit date: 04/09/2008    Years since quitting: 12.9   Smokeless tobacco: Never  Vaping Use   Vaping Use: Every day  Substance and Sexual Activity   Alcohol use: Not Currently   Drug use: Not on file   Sexual activity: Not on file  Other Topics Concern   Not on file  Social History Narrative   Not on file   Social Determinants of Health   Financial Resource Strain: Not on file  Food Insecurity: Not on file  Transportation Needs: Not on file  Physical Activity: Not on file  Stress: Not on file  Social Connections: Not on file     Family History: The patient's family history includes COPD in her mother; Diabetes in her sister; Heart failure in her mother; Hyperlipidemia in her mother and sister; Hypertension in her sister; Stroke in her mother.  ROS:   Please see the history of present illness.    All other systems reviewed and are negative.  EKGs/Labs/Other Studies  Reviewed:    The following studies were reviewed today:  February 02, 2021 echo Left ventricular function normal, 55% Right ventricular function normal No significant valvular abnormalities  Apple watch heart rate monitor trends reviewed  EKG:  The ekg ordered today demonstrates sinus rhythm.  Normal intervals.  No preexcitation.   Recent Labs: 01/23/2021: ALT 13; BUN 5; Creatinine, Ser 0.67; Hemoglobin 14.5; Magnesium 1.9; Platelets 273; Potassium 4.4; Sodium 141; TSH 1.990  Recent Lipid Panel No results found for: CHOL, TRIG, HDL, CHOLHDL, VLDL, LDLCALC, LDLDIRECT  Physical Exam:    VS:  Ht 5\' 9"  (1.753 m)   Wt 136 lb 6.4 oz (61.9 kg)   SpO2 97%   BMI 20.14 kg/m     Wt Readings from Last 3 Encounters:   03/01/21 136 lb 6.4 oz (61.9 kg)  01/23/21 130 lb (59 kg)  12/26/20 130 lb (59 kg)    Orthostatics Flat 104/60, 66 Sitting 100/62, 80 Standing 106/66, 88 Standing 2 minutes 104/60, 88  GEN:  Well nourished, well developed in no acute distress HEENT: Normal NECK: No JVD; No carotid bruits LYMPHATICS: No lymphadenopathy CARDIAC: RRR, no murmurs, rubs, gallops RESPIRATORY:  Clear to auscultation without rales, wheezing or rhonchi  ABDOMEN: Soft, non-tender, non-distended MUSCULOSKELETAL:  No edema; No deformity  SKIN: Warm and dry NEUROLOGIC:  Alert and oriented x 3 PSYCHIATRIC:  Normal affect       ASSESSMENT:    1. Vasovagal syncope    PLAN:    In order of problems listed above:  #Vasovagal syncope Her constellation of symptoms is very classic for a diagnosis of vasovagal syncope.  We discussed the pathophysiology of vasovagal syncope during today's clinic appointment.  Her constellation of symptoms is not consistent with a diagnosis of POTS.  We discussed this at length during today's appointment.  We discussed the limited treatment options available for dysautonomia and vasovagal syncope.  I recommended that she stay hydrated.  She can liberalize her salt intake.  She should take the warning signs of impending syncope very seriously.  She should get to the ground immediately if she experiences these warning signs.  We also discussed counterpressure maneuvers that she can try to abort impending syncope.  I provided a handout about counterpressure maneuvers today.  I discussed including regular aerobic exercise into her routine.  She can try to incorporate a recumbent aerobic exercise such as recumbent cycling or rowing into her exercise regimen.  I do not recommend beta-blockers.  These tend to exacerbate symptoms in patients with vasovagal syncope.  This was discussed with the patient.  Follow-up with the EP on an as-needed basis.   Total time spent with patient today 45  minutes. This includes reviewing records, evaluating the patient and coordinating care.  Medication Adjustments/Labs and Tests Ordered: Current medicines are reviewed at length with the patient today.  Concerns regarding medicines are outlined above.  No orders of the defined types were placed in this encounter.  No orders of the defined types were placed in this encounter.    Signed, 12/28/20. Rossie Muskrat, MD, [facility], [facility] 03/01/2021 6:13 PM    Electrophysiology [facility]

## 2021-03-08 ENCOUNTER — Other Ambulatory Visit: Payer: Self-pay | Admitting: Family Medicine

## 2021-04-15 ENCOUNTER — Other Ambulatory Visit: Payer: Self-pay | Admitting: Family Medicine

## 2021-04-19 ENCOUNTER — Other Ambulatory Visit: Payer: Self-pay | Admitting: Family Medicine

## 2021-04-19 DIAGNOSIS — G90A Postural orthostatic tachycardia syndrome (POTS): Secondary | ICD-10-CM

## 2021-04-19 DIAGNOSIS — T671XXA Heat syncope, initial encounter: Secondary | ICD-10-CM

## 2021-06-12 ENCOUNTER — Ambulatory Visit (INDEPENDENT_AMBULATORY_CARE_PROVIDER_SITE_OTHER): Payer: BC Managed Care – PPO | Admitting: Family Medicine

## 2021-06-12 VITALS — BP 120/70 | Ht 69.0 in | Wt 128.0 lb

## 2021-06-12 DIAGNOSIS — M25552 Pain in left hip: Secondary | ICD-10-CM

## 2021-06-12 DIAGNOSIS — M25551 Pain in right hip: Secondary | ICD-10-CM

## 2021-06-12 DIAGNOSIS — E8940 Asymptomatic postprocedural ovarian failure: Secondary | ICD-10-CM

## 2021-06-12 DIAGNOSIS — G8929 Other chronic pain: Secondary | ICD-10-CM | POA: Insufficient documentation

## 2021-06-12 DIAGNOSIS — M25561 Pain in right knee: Secondary | ICD-10-CM

## 2021-06-12 DIAGNOSIS — M25562 Pain in left knee: Secondary | ICD-10-CM

## 2021-06-12 DIAGNOSIS — Z90710 Acquired absence of both cervix and uterus: Secondary | ICD-10-CM

## 2021-06-12 NOTE — Assessment & Plan Note (Signed)
Acute on chronic in nature.  Having muscle cramps as well as redness and swelling in multiple joints. ?-Counseled on home exercise therapy and supportive care. ?-Uric acid, ANA, sed rate and CRP. ? ?

## 2021-06-12 NOTE — Progress Notes (Signed)
?  Autumn Ray - 41 y.o. female MRN [id number]  Date of birth: [birth year] ? ?SUBJECTIVE:  Including CC & ROS.  ?No chief complaint on file. ? ? ?Autumn Ray is a 41 y.o. female that is presenting with joint pain in multiple joints as well as muscle aches and cramping.  Symptoms been ongoing for 1 week.  Reports night sweats and elevated temperatures.  No recent travel.  No rashes.  No new medications. ? ? ?Review of Systems ?See HPI  ? ?HISTORY: Past Medical, Surgical, Social, and Family History Reviewed & Updated per EMR.   ?Pertinent Historical Findings include: ? ?No past medical history on file. ? ?Past Surgical History:  ?Procedure Laterality Date  ? ABDOMINAL HYSTERECTOMY    ? EYE SURGERY    ? FRACTURE SURGERY    ? ? ? ?PHYSICAL EXAM:  ?VS: BP 120/70   Ht 5\' 9"  (1.753 m)   Wt 128 lb (58.1 kg)   BMI 18.90 kg/m?  ?Physical Exam ?Gen: NAD, alert, cooperative with exam, well-appearing ?MSK:  ?Neurovascularly intact   ? ? ? ? ?ASSESSMENT & PLAN:  ? ?Chronic arthralgias of knees and hips ?Acute on chronic in nature.  Having muscle cramps as well as redness and swelling in multiple joints. ?-Counseled on home exercise therapy and supportive care. ?-Uric acid, ANA, sed rate and CRP. ? ? ?Post hysterectomy menopause ?Hysterectomy was about 7 years ago.  Was left with 1 ovary and concerning for ovarian failure was early menopause. ?-FSH, estradiol, prolactin and TSH ? ? ? ? ?

## 2021-06-12 NOTE — Assessment & Plan Note (Signed)
Hysterectomy was about 7 years ago.  Was left with 1 ovary and concerning for ovarian failure was early menopause. ?-FSH, estradiol, prolactin and TSH ?

## 2021-06-12 NOTE — Patient Instructions (Signed)
Good to see you ?I will call with the results.   ?Please send me a message in MyChart with any questions or updates.  ?Please see me back will depend on lab work.  ? ?--Dr. Jordan Likes ? ?

## 2021-06-14 ENCOUNTER — Ambulatory Visit: Payer: BC Managed Care – PPO | Admitting: Family Medicine

## 2021-06-16 ENCOUNTER — Encounter: Payer: Self-pay | Admitting: Family Medicine

## 2021-06-16 LAB — ANA,IFA RA DIAG PNL W/RFLX TIT/PATN
ANA Titer 1: NEGATIVE
Cyclic Citrullin Peptide Ab: 2 units (ref 0–19)
Rheumatoid fact SerPl-aCnc: <10 IU/mL (ref <14.0)

## 2021-06-16 LAB — URIC ACID: Uric Acid: 3.5 mg/dL (ref 2.6–6.2)

## 2021-06-16 LAB — FOLLICLE STIMULATING HORMONE: FSH: 5.6 m[IU]/mL

## 2021-06-16 LAB — PROLACTIN: Prolactin: 9.2 ng/mL (ref 4.8–23.3)

## 2021-06-16 LAB — C-REACTIVE PROTEIN: CRP: <1 mg/L (ref 0–10)

## 2021-06-16 LAB — ESTRADIOL: Estradiol: 46.9 pg/mL

## 2021-06-16 LAB — SEDIMENTATION RATE: Sed Rate: 2 mm/hr (ref 0–32)

## 2021-06-16 LAB — TSH: TSH: 1.73 u[IU]/mL (ref 0.450–4.500)

## 2021-06-19 ENCOUNTER — Telehealth: Payer: Self-pay | Admitting: Family Medicine

## 2021-06-19 MED ORDER — BACLOFEN 10 MG PO TABS: 5.0000 mg | ORAL_TABLET | Freq: Three times a day (TID) | ORAL | 1 refills | Status: DC | PRN

## 2021-06-19 NOTE — Telephone Encounter (Signed)
Informed of results. Try baclofen for spasms.  ? ?Myra Rude, MD ?[facility] ?06/19/2021, 5:01 PM ? ?

## 2021-07-12 ENCOUNTER — Other Ambulatory Visit: Payer: Self-pay | Admitting: Family Medicine

## 2021-08-01 ENCOUNTER — Other Ambulatory Visit (HOSPITAL_BASED_OUTPATIENT_CLINIC_OR_DEPARTMENT_OTHER): Payer: Self-pay

## 2021-08-01 ENCOUNTER — Encounter: Payer: Self-pay | Admitting: Family Medicine

## 2021-08-01 ENCOUNTER — Ambulatory Visit (INDEPENDENT_AMBULATORY_CARE_PROVIDER_SITE_OTHER): Payer: BC Managed Care – PPO | Admitting: Family Medicine

## 2021-08-01 VITALS — BP 130/90 | Ht 69.0 in | Wt 128.0 lb

## 2021-08-01 DIAGNOSIS — M5410 Radiculopathy, site unspecified: Secondary | ICD-10-CM | POA: Diagnosis not present

## 2021-08-01 MED ORDER — KETOROLAC TROMETHAMINE 30 MG/ML IJ SOLN
30.0000 mg | Freq: Once | INTRAMUSCULAR | Status: AC
  Administered 2021-08-01: 30 mg via INTRAMUSCULAR

## 2021-08-01 MED ORDER — METHYLPREDNISOLONE ACETATE 40 MG/ML IJ SUSP
40.0000 mg | Freq: Once | INTRAMUSCULAR | Status: AC
  Administered 2021-08-01: 40 mg via INTRAMUSCULAR

## 2021-08-01 MED ORDER — HYDROCODONE-ACETAMINOPHEN 5-325 MG PO TABS
1.0000 | ORAL_TABLET | Freq: Three times a day (TID) | ORAL | 0 refills | Status: DC | PRN
  Filled 2021-08-01: qty 10, 4d supply, fill #0

## 2021-08-01 NOTE — Assessment & Plan Note (Signed)
Acutely occurring.  She seems to irritated the surrounding brachial plexus with the severity of pain as well as the route that is taking. ?-Counseled on home exercise therapy and supportive care. ?-IM Toradol and Depo-Medrol. ?-Norco. ?-Could consider physical therapy. ?

## 2021-08-01 NOTE — Patient Instructions (Signed)
Good to see you ?Please try heat  ?Please use the pain medicine as needed   ?Please send me a message in MyChart with any questions or updates.  ?Please see me back in 1-2 weeks or as needed if improved.  ? ?--Dr. Jordan Likes ? ?

## 2021-08-01 NOTE — Progress Notes (Signed)
?  [name] - 41 y.o. female MRN [id number]  Date of birth: [birth year] ? ?SUBJECTIVE:  Including CC & ROS.  ?No chief complaint on file. ? ? ?Autumn Ray is a 41 y.o. female that is presenting with acute right shoulder and arm pain as well as periscapular pain and right-sided chest pain.  The pain has been ongoing since she had to maneuver her jeep when the parasternally when out.  Pain is severe in nature. ? ? ?Review of Systems ?See HPI  ? ?HISTORY: Past Medical, Surgical, Social, and Family History Reviewed & Updated per EMR.   ?Pertinent Historical Findings include: ? ?History reviewed. No pertinent past medical history. ? ?Past Surgical History:  ?Procedure Laterality Date  ? ABDOMINAL HYSTERECTOMY    ? EYE SURGERY    ? FRACTURE SURGERY    ? ? ? ?PHYSICAL EXAM:  ?VS: BP 130/90 (BP Location: Left Arm, Patient Position: Sitting)   Ht 5\' 9"  (1.753 m)   Wt 128 lb (58.1 kg)   BMI 18.90 kg/m?  ?Physical Exam ?Gen: NAD, alert, cooperative with exam, well-appearing ?MSK:  ?Neurovascularly intact   ? ? ? ? ?ASSESSMENT & PLAN:  ? ?Brachial neuritis of right upper extremity ?Acutely occurring.  She seems to irritated the surrounding brachial plexus with the severity of pain as well as the route that is taking. ?-Counseled on home exercise therapy and supportive care. ?-IM Toradol and Depo-Medrol. ?-Norco. ?-Could consider physical therapy. ? ? ? ? ?

## 2021-08-03 ENCOUNTER — Other Ambulatory Visit: Payer: Self-pay | Admitting: Family Medicine

## 2021-08-03 ENCOUNTER — Telehealth: Payer: Self-pay | Admitting: Family Medicine

## 2021-08-03 NOTE — Telephone Encounter (Signed)
Patient called states still very painful &diffuculty when swallowing (pain shooting across the chest area) ---Pt ask if someone can call her w/ advise or does she need to come in. ? ?--glh ?

## 2021-08-03 NOTE — Telephone Encounter (Signed)
Answered patient questions.  ? ?Rosemarie Ax, MD ?[facility] ?08/03/2021, 1:06 PM ? ?

## 2021-08-03 NOTE — Telephone Encounter (Signed)
I spoke to pt- she states she had no relief with the Toradol and Depo injections on 08/01/21. She states she is taking her hydroco/apap 5-325 mg at night as prescribed and it is not touching her pain.  ?She states she feels like she has a "lump" stuck in her throat and she has to take shorter breaths to help with her right sided chest pain. Please advise.  ?

## 2021-08-15 ENCOUNTER — Other Ambulatory Visit: Payer: Self-pay | Admitting: Family Medicine

## 2021-09-14 ENCOUNTER — Other Ambulatory Visit: Payer: Self-pay | Admitting: Family Medicine

## 2021-09-28 ENCOUNTER — Other Ambulatory Visit: Payer: Self-pay | Admitting: Family Medicine

## 2021-10-18 ENCOUNTER — Other Ambulatory Visit: Payer: Self-pay | Admitting: Family Medicine

## 2021-10-23 ENCOUNTER — Emergency Department
Admission: EM | Admit: 2021-10-23 | Discharge: 2021-10-23 | Disposition: A | Discharge status: Home / Self Care | Payer: BC Managed Care – PPO | Attending: Family Medicine | Admitting: Family Medicine

## 2021-10-23 ENCOUNTER — Emergency Department (INDEPENDENT_AMBULATORY_CARE_PROVIDER_SITE_OTHER): Payer: BC Managed Care – PPO

## 2021-10-23 DIAGNOSIS — M7989 Other specified soft tissue disorders: Secondary | ICD-10-CM | POA: Diagnosis not present

## 2021-10-23 DIAGNOSIS — M25531 Pain in right wrist: Secondary | ICD-10-CM | POA: Diagnosis not present

## 2021-10-23 DIAGNOSIS — M659 Synovitis and tenosynovitis, unspecified: Secondary | ICD-10-CM | POA: Diagnosis not present

## 2021-10-23 MED ORDER — METHYLPREDNISOLONE 4 MG PO TBPK: ORAL_TABLET | ORAL | 0 refills | Status: DC

## 2021-10-23 NOTE — ED Triage Notes (Signed)
Pt presents with rt wrist pain and swelling that began Saturday. NKI.

## 2021-10-23 NOTE — ED Provider Notes (Signed)
[facility]    CSN: [id number] Arrival date & time: 10/23/21  1508      History   Chief Complaint Chief Complaint  Patient presents with   Wrist Pain    HPI Autumn Ray is a 41 y.o. female.   HPI  Patient has right wrist pain and swelling is been going on for the last 3 days.  It is getting worse.  Swollen.  It is slightly red.  She did not have any accident or injury but she is moving.  She has been packing boxes and using her hands extensively and picking up and moving furniture and boxes.  She thinks it may be from overuse. Has not had difficulty with this wrist previously She is right-handed She states that she has hypermobility issue and that straining her joints is not uncommon  History reviewed. No pertinent past medical history.  Patient Active Problem List   Diagnosis Date Noted   Brachial neuritis of right upper extremity 08/01/2021   Chronic arthralgias of knees and hips 06/12/2021   Post hysterectomy menopause 06/12/2021   POTS (postural orthostatic tachycardia syndrome) 01/23/2021   Heat syncope 01/23/2021   Cervical radiculopathy 12/08/2020   Thoracic outlet syndrome 08/12/2020   Myofascial pain syndrome 06/01/2020   Scapular dyskinesis 03/09/2020   Hypermobility syndrome 03/09/2020   Strain of calf muscle 02/03/2020    Past Surgical History:  Procedure Laterality Date   ABDOMINAL HYSTERECTOMY     EYE SURGERY     FRACTURE SURGERY      OB History   No obstetric history on file.      Home Medications    Prior to Admission medications   Medication Sig Start Date End Date Taking? Authorizing Provider  methylPREDNISolone (MEDROL DOSEPAK) 4 MG TBPK tablet tad 10/23/21  Yes Eustace Moore, MD  baclofen (LIORESAL) 10 MG tablet TAKE 0.5 TABLETS BY MOUTH 3 TIMES DAILY AS NEEDED FOR MUSCLE SPASMS. 09/28/21   Myra Rude, MD  nortriptyline (PAMELOR) 10 MG capsule TAKE 1 CAPSULE BY MOUTH EVERYDAY AT BEDTIME 10/18/21   Myra Rude, MD    Family History Family History  Problem Relation Age of Onset   Heart failure Mother    Stroke Mother    Hyperlipidemia Mother    COPD Mother    Hypertension Sister    Hyperlipidemia Sister    Diabetes Sister     Social History Social History   Tobacco Use   Smoking status: Former    Types: Cigarettes    Quit date: 04/09/2008    Years since quitting: 13.5   Smokeless tobacco: Never  Vaping Use   Vaping Use: Every day  Substance Use Topics   Alcohol use: Not Currently     Allergies   Iodinated contrast media, Doxycycline, and Latex   Review of Systems Review of Systems  See HPI Physical Exam Triage Vital Signs ED Triage Vitals  Enc Vitals Group     BP 10/23/21 1521 130/87     Pulse Rate 10/23/21 1521 82     Resp 10/23/21 1521 14     Temp 10/23/21 1521 99.2 F (37.3 C)     Temp Source 10/23/21 1521 Oral     SpO2 10/23/21 1521 97 %     Weight --      Height --      Head Circumference --      Peak Flow --      Pain Score 10/23/21 1519 7  Pain Loc --      Pain Edu? --      Excl. in GC? --    No data found.  Updated Vital Signs BP 130/87 (BP Location: Left Arm)   Pulse 82   Temp 99.2 F (37.3 C) (Oral)   Resp 14   SpO2 97%        Physical Exam Constitutional:      General: She is not in acute distress.    Appearance: She is well-developed.  HENT:     Head: Normocephalic and atraumatic.  Eyes:     Conjunctiva/sclera: Conjunctivae normal.     Pupils: Pupils are equal, round, and reactive to light.  Cardiovascular:     Rate and Rhythm: Normal rate.  Pulmonary:     Effort: Pulmonary effort is normal. No respiratory distress.  Abdominal:     General: There is no distension.     Palpations: Abdomen is soft.  Musculoskeletal:        General: Normal range of motion.     Cervical back: Normal range of motion.     Comments: Left wrist has diffuse swelling around the carpal region, more so towards the ulnar prominence.  There is  tenderness in this area.  There is good range of motion.  Pain with ulnar deviation.  Patient can move fingers well, flex and extend.  Grip is weak secondary to pain.  Normal sensation in fingers  Skin:    General: Skin is warm and dry.  Neurological:     Mental Status: She is alert.  Psychiatric:        Mood and Affect: Mood normal.      UC Treatments / Results  Labs (all labs ordered are listed, but only abnormal results are displayed) Labs Reviewed - No data to display  EKG   Radiology DG Wrist Complete Right  Result Date: 10/23/2021 CLINICAL DATA:  Pain and swelling EXAM: RIGHT WRIST - COMPLETE 3+ VIEW COMPARISON:  None Available. FINDINGS: No fracture or dislocation is seen. There are no radiopaque foreign bodies. IMPRESSION: No fracture or dislocation is seen in right wrist. Electronically Signed   By: Ernie Avena M.D.   On: 10/23/2021 15:37    Procedures Procedures (including critical care time)  Medications Ordered in UC Medications - No data to display  Initial Impression / Assessment and Plan / UC Course  I have reviewed the triage vital signs and the nursing notes.  Pertinent labs & imaging results that were available during my care of the patient were reviewed by me and considered in my medical decision making (see chart for details).     Patient's wrist is red swollen and tender, x-rays are normal.  This appears to be a tenosynovitis.  We will treat accordingly.  She has seen Dr. Jordan Likes in the past, and may follow-up with him if she continues to have problems Final Clinical Impressions(s) / UC Diagnoses   Final diagnoses:  Right wrist pain  Tenosynovitis of right wrist     Discharge Instructions      Take the Dosepak as directed.  This is a steroid anti-inflammatory medicine.  Take all of day 1 today May take Tylenol in addition if needed for pain Wear brace until pain improves.  Likely 1 to 2 weeks Use ice for 20 minutes every couple of  hours See Dr. Jordan Likes if you fail to improve   ED Prescriptions     Medication Sig Dispense Auth. Provider   methylPREDNISolone (MEDROL  DOSEPAK) 4 MG TBPK tablet tad 21 tablet Eustace Moore, MD      PDMP not reviewed this encounter.   Eustace Moore, MD 10/23/21 1655

## 2021-10-23 NOTE — Discharge Instructions (Signed)
Take the Dosepak as directed.  This is a steroid anti-inflammatory medicine.  Take all of day 1 today May take Tylenol in addition if needed for pain Wear brace until pain improves.  Likely 1 to 2 weeks Use ice for 20 minutes every couple of hours See Dr. Jordan Likes if you fail to improve

## 2021-12-29 ENCOUNTER — Ambulatory Visit (INDEPENDENT_AMBULATORY_CARE_PROVIDER_SITE_OTHER): Payer: BC Managed Care – PPO | Admitting: Family Medicine

## 2021-12-29 ENCOUNTER — Encounter: Payer: Self-pay | Admitting: Family Medicine

## 2021-12-29 VITALS — BP 125/89 | Ht 69.0 in | Wt 128.0 lb

## 2021-12-29 DIAGNOSIS — M357 Hypermobility syndrome: Secondary | ICD-10-CM | POA: Diagnosis not present

## 2021-12-29 DIAGNOSIS — M7918 Myalgia, other site: Secondary | ICD-10-CM

## 2021-12-29 MED ORDER — KETOROLAC TROMETHAMINE 30 MG/ML IJ SOLN
30.0000 mg | Freq: Once | INTRAMUSCULAR | Status: AC
  Administered 2021-12-29: 30 mg via INTRAMUSCULAR

## 2021-12-29 MED ORDER — METHYLPREDNISOLONE ACETATE 40 MG/ML IJ SUSP
40.0000 mg | Freq: Once | INTRAMUSCULAR | Status: AC
  Administered 2021-12-29: 40 mg via INTRAMUSCULAR

## 2021-12-29 NOTE — Assessment & Plan Note (Signed)
Acute on chronic in nature.  Having exacerbation of the pain mainly on the right side. -Counseled on home exercise therapy and supportive care. -IM Depo-Medrol and Toradol. -Refer to neurology.

## 2021-12-29 NOTE — Progress Notes (Signed)
  Autumn Ray - 41 y.o. female MRN [id number]  Date of birth: Jun 27, 1980  SUBJECTIVE:  Including CC & ROS.  No chief complaint on file.   Autumn Ray is a 41 y.o. female that is presenting with worsening of her right-sided upper pain as well as pain globally throughout her body.  She has been under more stress than normal.  She is feeling different sensations throughout her right side.  No weakness appreciated.    Review of Systems See HPI   HISTORY: Past Medical, Surgical, Social, and Family History Reviewed & Updated per EMR.   Pertinent Historical Findings include:  History reviewed. No pertinent past medical history.  Past Surgical History:  Procedure Laterality Date   ABDOMINAL HYSTERECTOMY     EYE SURGERY     FRACTURE SURGERY       PHYSICAL EXAM:  VS: BP 125/89 (BP Location: Left Arm, Patient Position: Sitting)   Ht 5\' 9"  (1.753 m)   Wt 128 lb (58.1 kg)   BMI 18.90 kg/m  Physical Exam Gen: NAD, alert, cooperative with exam, well-appearing MSK:  Neurovascularly intact       ASSESSMENT & PLAN:   Myofascial pain syndrome Acute on chronic in nature.  Having exacerbation of the pain mainly on the right side. -Counseled on home exercise therapy and supportive care. -IM Depo-Medrol and Toradol. -Refer to neurology.  Hypermobility syndrome Likely contributing to her current pain.  This is acute on chronic in nature. -Could consider compounding naltrexone.

## 2021-12-29 NOTE — Assessment & Plan Note (Signed)
Likely contributing to her current pain.  This is acute on chronic in nature. -Could consider compounding naltrexone.

## 2021-12-29 NOTE — Patient Instructions (Signed)
Good to see you  Please let me know if your pain improves. Once improved we can consider the compounded medication Please send me a message in MyChart with any questions or updates.  Please see me back in 3 weeks or as needed if better.   --Dr. Raeford Razor

## 2022-01-05 ENCOUNTER — Encounter: Payer: Self-pay | Admitting: Neurology

## 2022-01-22 ENCOUNTER — Ambulatory Visit (INDEPENDENT_AMBULATORY_CARE_PROVIDER_SITE_OTHER): Payer: BC Managed Care – PPO | Admitting: Family Medicine

## 2022-01-22 ENCOUNTER — Encounter: Payer: Self-pay | Admitting: Family Medicine

## 2022-01-22 VITALS — BP 102/78 | Ht 69.0 in | Wt 129.0 lb

## 2022-01-22 DIAGNOSIS — M357 Hypermobility syndrome: Secondary | ICD-10-CM

## 2022-01-22 MED ORDER — AMBULATORY NON FORMULARY MEDICATION: 2 refills | Status: DC

## 2022-01-22 NOTE — Progress Notes (Signed)
  Autumn Ray - 41 y.o. female MRN [id number]  Date of birth: June 20, 1980  SUBJECTIVE:  Including CC & ROS.  No chief complaint on file.   Autumn Ray is a 41 y.o. female that is presenting with acute worsening of her back and neck pain.  She has tried nortriptyline and baclofen was limited improvement.  She has a neurology appointment in November..    Review of Systems See HPI   HISTORY: Past Medical, Surgical, Social, and Family History Reviewed & Updated per EMR.   Pertinent Historical Findings include:  History reviewed. No pertinent past medical history.  Past Surgical History:  Procedure Laterality Date   ABDOMINAL HYSTERECTOMY     EYE SURGERY     FRACTURE SURGERY       PHYSICAL EXAM:  VS: BP 102/78 (BP Location: Left Arm, Patient Position: Sitting)   Ht 5\' 9"  (1.753 m)   Wt 129 lb (58.5 kg)   BMI 19.05 kg/m  Physical Exam Gen: NAD, alert, cooperative with exam, well-appearing MSK:  Neurovascularly intact       ASSESSMENT & PLAN:   Hypermobility syndrome Continues to have pain intermittently.  Can be severe at times.  She does get cramping at different portions of her body that occur without warning.  Lab work has been normal to this point. -Counseled on home exercise therapy and supportive care. -Compounded naltrexone. -Could consider injection.

## 2022-01-22 NOTE — Patient Instructions (Signed)
Good to see you We'll get the medicine sent in.  You can slowly wean off of the nortriptyline   Please send me a message in MyChart with any questions or updates.  Please see me back in 4-6 weeks.   --Dr. Raeford Razor

## 2022-01-22 NOTE — Assessment & Plan Note (Signed)
Continues to have pain intermittently.  Can be severe at times.  She does get cramping at different portions of her body that occur without warning.  Lab work has been normal to this point. -Counseled on home exercise therapy and supportive care. -Compounded naltrexone. -Could consider injection.

## 2022-01-26 ENCOUNTER — Ambulatory Visit
Admission: EM | Admit: 2022-01-26 | Discharge: 2022-01-26 | Disposition: A | Discharge status: Home / Self Care | Payer: BC Managed Care – PPO

## 2022-01-26 ENCOUNTER — Encounter: Payer: Self-pay | Admitting: Emergency Medicine

## 2022-01-26 DIAGNOSIS — S39012A Strain of muscle, fascia and tendon of lower back, initial encounter: Secondary | ICD-10-CM

## 2022-01-26 NOTE — Discharge Instructions (Addendum)
Advised to pick up medication (Naltrexone) prescribed by [facility] orthopedic provider on 01/22/2022 and take as directed.  Advised patient if symptoms worsen and/or unresolved please follow-up with [facility] orthopedic provider for further evaluation.

## 2022-01-26 NOTE — ED Provider Notes (Signed)
[name]    CSN: [id number] Arrival date & time: 01/26/22  1529      History   Chief Complaint Chief Complaint  Patient presents with   Back Pain    HPI Autumn Ray is a 41 y.o. female.   HPI 41 year old female presents with chronic back pain worse since yesterday.  PMH significant for myofascial pain syndrome, thoracic outlet syndrome, brachial neuritis of right upper extremity and is currently followed by [facility] orthopedic provider.  Patient has been compounded medication by her orthopedic provider and has yet to pick that medication up today.  History reviewed. No pertinent past medical history.  Patient Active Problem List   Diagnosis Date Noted   Brachial neuritis of right upper extremity 08/01/2021   Chronic arthralgias of knees and hips 06/12/2021   Post hysterectomy menopause 06/12/2021   POTS (postural orthostatic tachycardia syndrome) 01/23/2021   Heat syncope 01/23/2021   Cervical radiculopathy 12/08/2020   Thoracic outlet syndrome 08/12/2020   Myofascial pain syndrome 06/01/2020   Scapular dyskinesis 03/09/2020   Hypermobility syndrome 03/09/2020   Strain of calf muscle 02/03/2020    Past Surgical History:  Procedure Laterality Date   ABDOMINAL HYSTERECTOMY     EYE SURGERY     FRACTURE SURGERY      OB History   No obstetric history on file.      Home Medications    Prior to Admission medications   Medication Sig Start Date End Date Taking? Authorizing Provider  albuterol (VENTOLIN HFA) 108 (90 Base) MCG/ACT inhaler Inhale into the lungs. 07/08/21  Yes [provider]  AMBULATORY NON FORMULARY MEDICATION Naltrexone 1 mg/cc. Take 1 cc per day until pain relieved or a dose of 5 cc is achieved. 01/22/22   Rosemarie Ax, MD  baclofen (LIORESAL) 10 MG tablet TAKE 0.5 TABLETS BY MOUTH 3 TIMES DAILY AS NEEDED FOR MUSCLE SPASMS. 09/28/21   Rosemarie Ax, MD  methylPREDNISolone (MEDROL DOSEPAK) 4 MG TBPK tablet  tad Patient not taking: Reported on 01/26/2022 10/23/21   Raylene Everts, MD  nortriptyline (PAMELOR) 10 MG capsule TAKE 1 CAPSULE BY MOUTH EVERYDAY AT BEDTIME Patient not taking: Reported on 01/26/2022 10/18/21   Rosemarie Ax, MD    Family History Family History  Problem Relation Age of Onset   Heart failure Mother    Stroke Mother    Hyperlipidemia Mother    COPD Mother    Hypertension Sister    Hyperlipidemia Sister    Diabetes Sister     Social History Social History   Tobacco Use   Smoking status: Former    Types: Cigarettes    Quit date: 04/09/2008    Years since quitting: 13.8   Smokeless tobacco: Never  Vaping Use   Vaping Use: Every day   Substances: Nicotine, Flavoring  Substance Use Topics   Alcohol use: Not Currently   Drug use: Never     Allergies   Iodinated contrast media, Doxycycline, and Latex   Review of Systems Review of Systems  Musculoskeletal:  Positive for back pain.  All other systems reviewed and are negative.    Physical Exam Triage Vital Signs ED Triage Vitals  Enc Vitals Group     BP 01/26/22 1541 122/83     Pulse Rate 01/26/22 1541 86     Resp 01/26/22 1541 14     Temp 01/26/22 1541 99.2 F (37.3 C)     Temp Source 01/26/22 1541 Oral  SpO2 01/26/22 1541 98 %     Weight 01/26/22 1544 128 lb 15.5 oz (58.5 kg)     Height --      Head Circumference --      Peak Flow --      Pain Score 01/26/22 1543 8     Pain Loc --      Pain Edu? --      Excl. in [place]? --    No data found.  Updated Vital Signs BP 122/83 (BP Location: Left Arm)   Pulse 86   Temp 99.2 F (37.3 C) (Oral)   Resp 14   Wt 128 lb 15.5 oz (58.5 kg)   SpO2 98%   BMI 19.05 kg/m   Physical Exam Vitals and nursing note reviewed.  Constitutional:      General: She is not in acute distress.    Appearance: Normal appearance. She is normal weight. She is not ill-appearing.  HENT:     Head: Normocephalic and atraumatic.     Mouth/Throat:     Mouth:  Mucous membranes are moist.     Pharynx: Oropharynx is clear.  Eyes:     Extraocular Movements: Extraocular movements intact.     Conjunctiva/sclera: Conjunctivae normal.     Pupils: Pupils are equal, round, and reactive to light.  Cardiovascular:     Rate and Rhythm: Normal rate and regular rhythm.     Pulses: Normal pulses.     Heart sounds: Normal heart sounds.  Pulmonary:     Effort: Pulmonary effort is normal.     Breath sounds: Normal breath sounds. No wheezing, rhonchi or rales.  Abdominal:     Tenderness: There is no right CVA tenderness or left CVA tenderness.  Musculoskeletal:        General: Normal range of motion.     Cervical back: Normal range of motion and neck supple. No tenderness.  Lymphadenopathy:     Cervical: No cervical adenopathy.  Skin:    General: Skin is warm and dry.  Neurological:     General: No focal deficit present.     Mental Status: She is alert. Mental status is at baseline. She is disoriented.     Cranial Nerves: No cranial nerve deficit.      UC Treatments / Results  Labs (all labs ordered are listed, but only abnormal results are displayed) Labs Reviewed - No data to display  EKG   Radiology No results found.  Procedures Procedures (including critical care time)  Medications Ordered in UC Medications - No data to display  Initial Impression / Assessment and Plan / UC Course  I have reviewed the triage vital signs and the nursing notes.  Pertinent labs & imaging results that were available during my care of the patient were reviewed by me and considered in my medical decision making (see chart for details).     MDM: 1.  Strain of lumbar region, initial encounter-Advised to pick up medication (Naltrexone) prescribed by [facility] orthopedic provider on 01/22/2022 and take as directed.  Advised patient if symptoms worsen and/or unresolved please follow-up with [facility] orthopedic provider for further evaluation.  Patient  discharged home, hemodynamically stable. Final Clinical Impressions(s) / UC Diagnoses   Final diagnoses:  Strain of lumbar region, initial encounter     Discharge Instructions      Advised to pick up medication (Naltrexone) prescribed by [place] orthopedic provider on 01/22/2022 and take as directed.  Advised patient if symptoms worsen and/or  unresolved please follow-up with [facility] orthopedic provider for further evaluation.     ED Prescriptions   None    PDMP not reviewed this encounter.   Eliezer Lofts, [place] 01/26/22 [phone]

## 2022-01-26 NOTE — ED Triage Notes (Signed)
Back pain - chronic, worse since yesterday  Hurts to breathe at times Pain more on right side -radiates up to R shoulder and around to right rib cage

## 2022-01-27 ENCOUNTER — Telehealth: Payer: Self-pay

## 2022-01-27 NOTE — Telephone Encounter (Signed)
Called pt to check on status since UC visit. States she was able to pick up meds that Dr Raeford Razor sent in and they seem to be helping. Advised to call back if any questions or concerns.

## 2022-02-15 ENCOUNTER — Telehealth: Payer: Self-pay | Admitting: *Deleted

## 2022-02-15 NOTE — Telephone Encounter (Signed)
[facility] called needing to know if her reason for 12/29/21 referral is more concerning for a nerve condition or if patient will be better seeing pain management.

## 2022-02-16 NOTE — Telephone Encounter (Signed)
Spoke with Annabelle Harman- informed her Dr. Jordan Likes does want patient to proceed with referral to evaluate for possible nerve related pain.

## 2022-02-16 NOTE — Progress Notes (Unsigned)
Initial neurology clinic note  SERVICE DATE: 02/21/22  Reason for Evaluation: Consultation requested by Rosemarie Ax, MD for an opinion regarding diffuse pain. My final recommendations will be communicated back to the requesting physician by way of shared medical record or letter to requesting physician via [place] mail.  HPI: This is Ms. Autumn Ray, a 41 y.o. right-handed female with a medical history of myofascial pain syndrome, hypermobility syndrome, smoker who presents to neurology clinic with the chief complaint of diffuse pain. The patient is accompanied by mom.  Patient first noticed symptoms for 25 years. She started passing out at age 21. She would pass out every 5 years. For the past 1-2 years, she will get pale, sweaty, feeling sick, and passing out or coming close about once per month. It comes out of no where. Her heart rate will fluctuate wildly (into 160s then 60s). Patient has seen cardiology who diagnosed vasovagal syncope (did not feel symptoms were consistent with POTS).  Patient also hurts throughout her entire body for the last 7-10 years. The pain is mostly on the right. It starts in the jaw, goes into the neck and arm and into legs. She gets numbness and tingling in hands and feet. She gets cramps throughout her body. Per mom, you can see the muscles bunched up. Touching the body during this causes intense pain. She saw a neurologist about 7 years ago and got a nerve study that patient reports was normal.  Pain has gotten worse over the last 5 years. Pain has a lot of pain when walking which makes it difficult walking. She denies walking with assistive device. She denies falls when walking but has had to grab onto things to prevent falls.  Patient feels like she is "in a fog" and does not feel right anymore. She has extreme fatigue. She does not sleep well due to pain. She also endorses severe diarrhea weakly. There has been no clear cause seen. She is due for another  colonoscopy as she had 5 polyps removed. She had a hysterectomy about 7 years ago due to uterine and cervical prolapse after her last child. She has also had a benign lump removed from her breast.  Sees Dr. Raeford Razor in sports medicine and has been diagnosed with myofascial pain syndrome. Having exacerbation on right side per visit note on 12/29/21. She has tried IM depo-medrol and toradol. This helped. She also has hypermobility syndrome contributing to her pain. Patient has previously tried baclofen, flexeril, nortriptyline, and naproxen. She is currently on Naltrexone (1 mg/cc. Take 1 cc per day until pain relieved or a dose of 5 cc is achieved). She has been on this for about 3 weeks. She does not feel this has helped much and does not like how it makes her feel.  The patient denies any constitutional symptoms like fever, night sweats, anorexia or unintentional weight loss.  EtOH use: Occasional, 2 beers about 3 times per week  Restrictive diet? No Family history of neuropathy/myopathy/NM disease? No, but patient does have a family history of PD and MS.   MEDICATIONS:  Outpatient Encounter Medications as of 02/21/2022  Medication Sig Note   albuterol (VENTOLIN HFA) 108 (90 Base) MCG/ACT inhaler Inhale into the lungs.    AMBULATORY NON FORMULARY MEDICATION Naltrexone 1 mg/cc. Take 1 cc per day until pain relieved or a dose of 5 cc is achieved. 01/26/2022: Will start today    [DISCONTINUED] baclofen (LIORESAL) 10 MG tablet TAKE 0.5 TABLETS BY MOUTH 3  TIMES DAILY AS NEEDED FOR MUSCLE SPASMS.    [DISCONTINUED] methylPREDNISolone (MEDROL DOSEPAK) 4 MG TBPK tablet tad (Patient not taking: Reported on 01/26/2022)    [DISCONTINUED] nortriptyline (PAMELOR) 10 MG capsule TAKE 1 CAPSULE BY MOUTH EVERYDAY AT BEDTIME (Patient not taking: Reported on 01/26/2022)    No facility-administered encounter medications on file as of 02/21/2022.    PAST MEDICAL HISTORY: Past Medical History:  Diagnosis Date    Hypermobility syndrome    Myofascial pain syndrome    Tobacco use     PAST SURGICAL HISTORY: Past Surgical History:  Procedure Laterality Date   ABDOMINAL HYSTERECTOMY     EYE SURGERY     FRACTURE SURGERY      ALLERGIES: Allergies  Allergen Reactions   Iodinated Contrast Media Anaphylaxis    Other reaction(s): ANAPHYLAXIS    Doxycycline Hives and Itching   Latex Hives    FAMILY HISTORY: Family History  Problem Relation Age of Onset   Heart failure Mother    Stroke Mother    Hyperlipidemia Mother    COPD Mother    Hypertension Sister    Hyperlipidemia Sister    Diabetes Sister     SOCIAL HISTORY: Social History   Tobacco Use   Smoking status: Every Day    Types: Cigarettes    Last attempt to quit: 04/09/2008    Years since quitting: 13.8   Smokeless tobacco: Never   Tobacco comments:    Occasional Cigarette   Vaping Use   Vaping Use: Every day   Substances: Nicotine, Flavoring  Substance Use Topics   Alcohol use: Yes    Comment: Occasional Drink   Drug use: Never   Social History   Social History Narrative   Are you right handed or left handed? Right Handed    Are you currently employed ? 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 No    Who lives with you? With children and dogs    What type of home do you live in: 1 story or 2 story? Lives in an apartment on the third floor.         OBJECTIVE: PHYSICAL EXAM: BP 117/81   Pulse 78   Ht _0  (1.753 m)   Wt 141 lb (64 kg)   SpO2 100%   BMI 20.82 kg/m   General: General appearance: Awake and alert. No distress. Cooperative with exam.  Skin: No obvious rash or jaundice. HEENT: Atraumatic. Anicteric. Lungs: Non-labored breathing on room air  Heart: Regular Extremities: No edema. No obvious deformity.  Musculoskeletal: No obvious joint swelling. Psych: Affect appropriate.  Neurological: Mental Status: Alert. Speech  fluent. No pseudobulbar affect Cranial Nerves: CNII: No RAPD. Visual fields grossly intact. CNIII, IV, VI: PERRL. No nystagmus. EOMI. CN V: Facial sensation intact bilaterally to fine touch. CN VII: Facial muscles symmetric and strong. No ptosis at rest. CN VIII: Hearing grossly intact bilaterally. CN IX: No hypophonia. CN X: Palate elevates symmetrically. CN XI: Full strength shoulder shrug bilaterally. CN XII: Tongue protrusion full and midline. No atrophy or fasciculations. No significant dysarthria Motor: Tone is normal. No atrophy. No grip, eyelid, or percussive myotonia.  Individual muscle group testing (MRC grade out of 5):  Movement     Neck flexion 5    Neck extension 5     Right Left   Shoulder abduction 5 5   Shoulder adduction 5 5  Shoulder ext rotation 5 5   Shoulder int rotation 5 5   Elbow flexion 5 5   Elbow extension 5 5   Wrist extension 5 5   Wrist flexion 5 5   Finger abduction - FDI 5 5   Finger abduction - ADM 5 5   Finger extension 5 5   Finger distal flexion - 2/_0 Finger distal flexion - 4/_1 Thumb flexion - FPL 5 5   Thumb abduction - APB 5 5    Hip flexion 5 5   Hip extension 5 5   Hip adduction 5 5   Hip abduction 5 5   Knee extension 5 5   Knee flexion 5 5   Dorsiflexion 5 5   Plantarflexion 5 5     Reflexes:  Right Left   Bicep 2+ 2+   Tricep 2+ 2+   BrRad 2+ 2+   Knee 2+ 2+   Ankle 2+ 2+ 1-2 beats of clonus on right, 2-3 beats on left   Pathological Reflexes: Babinski: flexor response bilaterally Hoffman: absent bilaterally Troemner: absent bilaterally Pectoral: absent bilaterally Sensation: Pinprick: Intact except around right lateral bicep were there was patchy sensation loss. Vibration: Intact in all extremities Proprioception: Intact in bilateral great toes Coordination: Intact finger-to- nose-finger bilaterally. Romberg negative. Gait: Able to rise from chair with arms crossed unassisted. Normal,  narrow-based gait. Able to tandem walk. Able to walk on toes and heels.  Lab and Test Review: Internal labs: Normal or unremarkable: TSH, CRP, ESR, RF, ANA, CCP, uric acid  MRI cervical spine wo contrast (12/24/20): FINDINGS: Alignment: Straightening of the normal cervical lordosis. No listhesis.   Vertebrae: Vertebral body height maintained without acute or chronic fracture. Bone marrow signal intensity within normal limits. Small benign hemangioma noted within the T2 vertebral body. No other discrete or worrisome osseous lesions. No abnormal marrow edema.   Cord: Normal signal and morphology.   Posterior Fossa, vertebral arteries, paraspinal tissues: Visualized brain and posterior fossa within normal limits. Craniocervical junction normal. Paraspinous and prevertebral soft tissues within normal limits. Normal intravascular flow voids seen within the vertebral arteries bilaterally.   Disc levels:   C2-C3: Unremarkable.   C3-C4: Minimal annular disc bulge with right-sided facet hypertrophy. No spinal stenosis. Foramina remain patent.   C4-C5: Mild annular disc bulge with uncovertebral spurring. No significant spinal stenosis. Foramina remain patent.   C5-C6:  Unremarkable.   C6-C7:  Unremarkable.   C7-T1:  Unremarkable.   Visualized upper thoracic spine demonstrates no significant finding.   IMPRESSION: 1. Minimal noncompressive disc bulging at C3-4 and C4-5 without stenosis or neural impingement. 2. Otherwise unremarkable MRI of the cervical spine.  ASSESSMENT: Kerisha Goughnour is a 41 y.o. female who presents for evaluation of diffuse pain and autonomic symptoms. She has a relevant medical history of myofascial pain syndrome, hypermobility syndrome, smoking. Her neurological examination is essentially normal today. Previous EMG (~ 7 years ago) was normal. The etiology of patient's symptoms is not clear. There are no clear neurologic deficits. While her symptoms may be  secondary to myofascial pain syndrome, I will look for other contributing factors including vitamin deficiency (given GI symptoms) and neuropathy. We discussed repeating EMG, but given her normal exam, it would also likely be normal. Given her autonomic complaints and diffuse pain, perhaps patient has a small fiber neuropathy. Patient was agreeable to skin biopsy to evaluate for this possibility.  PLAN: -Blood work: B1, B6, B12,  folate, vit E, copper -EMG deferred after discussion -Skin biopsy to evaluate for small fiber neuropathy (right leg) -May consider nicotinic acetylcholine receptor ab testing for autoimmune dysautonomia testing if other testing is unrevealing.  -Return to clinic to be determined after testing is complete  The impression above as well as the plan as outlined below were extensively discussed with the patient (in the company of mother) who voiced understanding. All questions were answered to their satisfaction.  When available, results of the above investigations and possible further recommendations will be communicated to the patient via telephone/MyChart. Patient to call office if not contacted after expected testing turnaround time.   Total time spent reviewing records, interview, history/exam, documentation, and coordination of care on day of encounter:  60 min   Thank you for allowing me to participate in patient's care.  If I can answer any additional questions, I would be pleased to do so.  Kai Levins, MD   CC: Patient, No Pcp Per No address on file  CC: Referring provider: Rosemarie Ax, MD [address],  [place] [address]

## 2022-02-21 ENCOUNTER — Ambulatory Visit (INDEPENDENT_AMBULATORY_CARE_PROVIDER_SITE_OTHER): Payer: BC Managed Care – PPO | Admitting: Neurology

## 2022-02-21 ENCOUNTER — Encounter: Payer: Self-pay | Admitting: Neurology

## 2022-02-21 ENCOUNTER — Other Ambulatory Visit (INDEPENDENT_AMBULATORY_CARE_PROVIDER_SITE_OTHER): Payer: BC Managed Care – PPO

## 2022-02-21 VITALS — BP 117/81 | HR 78 | Ht 69.0 in | Wt 141.0 lb

## 2022-02-21 DIAGNOSIS — M7918 Myalgia, other site: Secondary | ICD-10-CM

## 2022-02-21 DIAGNOSIS — R52 Pain, unspecified: Secondary | ICD-10-CM

## 2022-02-21 DIAGNOSIS — M357 Hypermobility syndrome: Secondary | ICD-10-CM

## 2022-02-21 DIAGNOSIS — R55 Syncope and collapse: Secondary | ICD-10-CM | POA: Diagnosis not present

## 2022-02-21 DIAGNOSIS — R000 Tachycardia, unspecified: Secondary | ICD-10-CM

## 2022-02-21 DIAGNOSIS — R29818 Other symptoms and signs involving the nervous system: Secondary | ICD-10-CM

## 2022-02-21 LAB — B12 AND FOLATE PANEL
Folate: 15.8 ng/mL (ref >5.9)
Vitamin B-12: 289 pg/mL (ref 211–911)

## 2022-02-21 NOTE — Patient Instructions (Signed)
I would like to do further testing to determine if there is a neurologic cause to your symptoms.  We will do blood work today to look for vitamin problems.  We will get a skin biopsy to look for small fiber neuropathy. You can schedule this today before leaving.  I will be in touch when I have your results to discuss next steps.   The physicians and staff at [facility] are committed to providing excellent care. You may receive a survey requesting feedback about your experience at our office. We strive to receive "very good" responses to the survey questions. If you feel that your experience would prevent you from giving the office a "very good " response, please contact our office to try to remedy the situation. We may be reached at [phone]. Thank you for taking the time out of your busy day to complete the survey.  Jacquelyne Balint, MD [facility]

## 2022-02-28 LAB — VITAMIN B6: Vitamin B6: 9 ng/mL (ref 2.1–21.7)

## 2022-02-28 LAB — VITAMIN B1: Vitamin B1 (Thiamine): 13 nmol/L (ref 8–30)

## 2022-02-28 LAB — VITAMIN E
Gamma-Tocopherol (Vit E): <1 mg/L (ref <=4.3)
Vitamin E (Alpha Tocopherol): 8.8 mg/L (ref 5.7–19.9)

## 2022-02-28 LAB — COPPER, SERUM: Copper: 90 ug/dL (ref 70–175)

## 2022-03-13 ENCOUNTER — Ambulatory Visit (INDEPENDENT_AMBULATORY_CARE_PROVIDER_SITE_OTHER): Payer: BC Managed Care – PPO | Admitting: Neurology

## 2022-03-13 DIAGNOSIS — R209 Unspecified disturbances of skin sensation: Secondary | ICD-10-CM

## 2022-03-13 DIAGNOSIS — R202 Paresthesia of skin: Secondary | ICD-10-CM | POA: Diagnosis not present

## 2022-03-13 DIAGNOSIS — R52 Pain, unspecified: Secondary | ICD-10-CM

## 2022-03-13 NOTE — Progress Notes (Signed)
Punch Biopsy Procedure Note  Preprocedure Diagnosis: paresthesia of skin   Postprocedure Diagnosis: same  Locations: Site 1: right  lateral ankle ;  Site 2: right  lateral proximal thigh ;   Indications: r/o small fiber neuropathy  Anesthesia: 5 mL Lidocaine 1% with epinephrine  Procedure Details Patient informed of the risks (including but not limited to bleeding, pain, infection, scar and infection) and benefits of the procedure.  Informed consent obtained.  The areas which were chosen for biopsy, as above, and surrounding areas were given a sterile prep using iodine and draped in the usual sterile fashion. The skin was then stretched perpendicular to the skin tension lines and sample removed using the 3 mm punch. Pressure applied, hemostasis achieved.   Dressing applied. The specimen(s) was sent for pathologic examination. The patient tolerated the procedure well.  Estimated Blood Loss: 1 ml  Condition: Stable  Complications: none.  Plan: 1. Instructed to keep the wound dry and covered for 24h and clean thereafter. 2. Warning signs of infection were reviewed.    Jacquelyne Balint, MD [facility]

## 2022-03-21 ENCOUNTER — Ambulatory Visit (INDEPENDENT_AMBULATORY_CARE_PROVIDER_SITE_OTHER): Payer: BC Managed Care – PPO | Admitting: Family Medicine

## 2022-03-21 ENCOUNTER — Encounter: Payer: Self-pay | Admitting: Family Medicine

## 2022-03-21 VITALS — BP 128/80 | Ht 69.0 in | Wt 130.0 lb

## 2022-03-21 DIAGNOSIS — I7300 Raynaud's syndrome without gangrene: Secondary | ICD-10-CM | POA: Diagnosis not present

## 2022-03-21 DIAGNOSIS — M7918 Myalgia, other site: Secondary | ICD-10-CM

## 2022-03-21 MED ORDER — DULOXETINE HCL 30 MG PO CPEP: 30.0000 mg | ORAL_CAPSULE | Freq: Every day | ORAL | 0 refills | Status: DC

## 2022-03-21 NOTE — Assessment & Plan Note (Signed)
Acute on chronic in nature.  Her hypermobility is likely contributing to her current pain situation.  Has not done well with naltrexone.  She has gotten mild improvement with the nortriptyline. -Counseled on home exercise therapy and supportive care. -Counseled on weaning off nortriptyline. -Initiate Cymbalta.

## 2022-03-21 NOTE — Assessment & Plan Note (Signed)
Acutely occurring.  She can actively have color change within her fingers and toes.   -Counseled on home exercise therapy and supportive care. -Referral to rheumatology.

## 2022-03-21 NOTE — Progress Notes (Signed)
  Autumn Ray - 41 y.o. female MRN [id number]  Date of birth: 1980/12/26  SUBJECTIVE:  Including CC & ROS.  No chief complaint on file.   Autumn Ray is a 41 y.o. female that is presenting with acute worsening of her generalized pain.  She recently had a biopsy performed by neurology to evaluate for small fiber neuropathy.  She does have a history of hypermobility which could be contributing.  Has not done well with the naltrexone.  She recently restarted her nortriptyline about a month ago.    Review of Systems See HPI   HISTORY: Past Medical, Surgical, Social, and Family History Reviewed & Updated per EMR.   Pertinent Historical Findings include:  Past Medical History:  Diagnosis Date   Hypermobility syndrome    Myofascial pain syndrome    Tobacco use     Past Surgical History:  Procedure Laterality Date   ABDOMINAL HYSTERECTOMY     EYE SURGERY     FRACTURE SURGERY       PHYSICAL EXAM:  VS: BP 128/80   Ht 5\' 9"  (1.753 m)   Wt 130 lb (59 kg)   BMI 19.20 kg/m  Physical Exam Gen: NAD, alert, cooperative with exam, well-appearing MSK:  Neurovascularly intact       ASSESSMENT & PLAN:   Raynaud's disease without gangrene Acutely occurring.  She can actively have color change within her fingers and toes.   -Counseled on home exercise therapy and supportive care. -Referral to rheumatology.  Myofascial pain syndrome Acute on chronic in nature.  Her hypermobility is likely contributing to her current pain situation.  Has not done well with naltrexone.  She has gotten mild improvement with the nortriptyline. -Counseled on home exercise therapy and supportive care. -Counseled on weaning off nortriptyline. -Initiate Cymbalta.

## 2022-03-21 NOTE — Patient Instructions (Signed)
Good to see you Please slowly wean off of the nortriptyline over the course of the week. You can start the Cymbalta 2 days after your last nortriptyline.  We'll put in a referral for your blood flow  Please send me a message in MyChart with any questions or updates.  Please see me back as needed.   --Dr. Jordan Likes

## 2022-03-22 ENCOUNTER — Encounter: Payer: Self-pay | Admitting: Neurology

## 2022-04-26 ENCOUNTER — Telehealth: Payer: Self-pay

## 2022-04-26 NOTE — Telephone Encounter (Signed)
Mychart msg sent

## 2022-06-22 IMAGING — MR MR CERVICAL SPINE W/O CM
4 of 5 series · 16 of 48 positions shown · non-contrast
Comparison: Radiograph from 12/08/2020.

CLINICAL DATA: Initial evaluation for right-sided neck pain
extending into the jaw, arm, and leg, with weakness and numbness.

EXAM:
MRI CERVICAL SPINE WITHOUT CONTRAST
TECHNIQUE: Multiplanar, multisequence MR imaging of the cervical spine was
performed. No intravenous contrast was administered.

[Series 3: T2 · sagittal · 3.0mm · 0.69mm/px · 5 of 13 slices shown (1 of 2)]
[im 1/13]
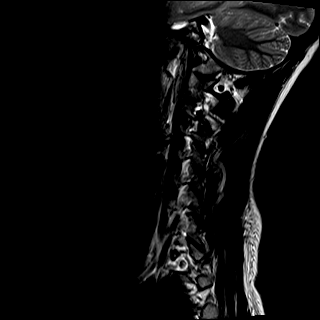
[im 4/13]
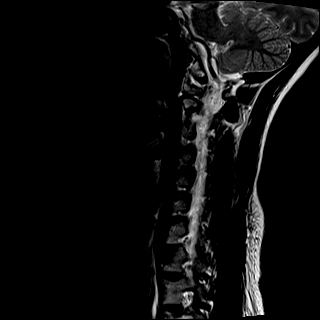
[im 7/13]
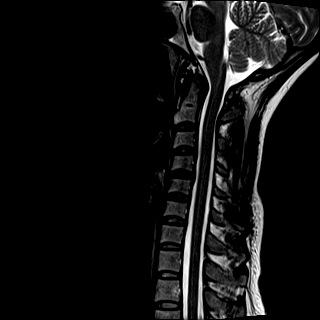
[im 10/13]
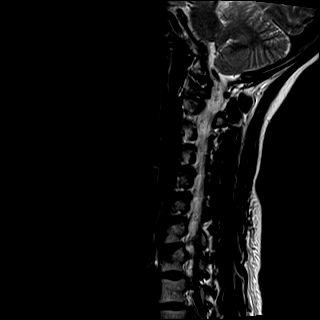
[im 13/13]
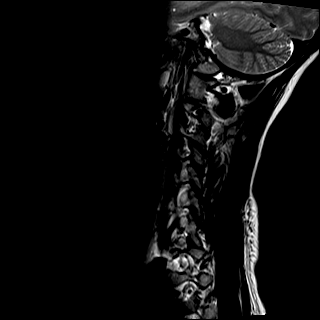

[Series 4: T1 · sagittal · 3.0mm · 0.34mm/px · 3 of 13 slices shown]
[im 3/13]
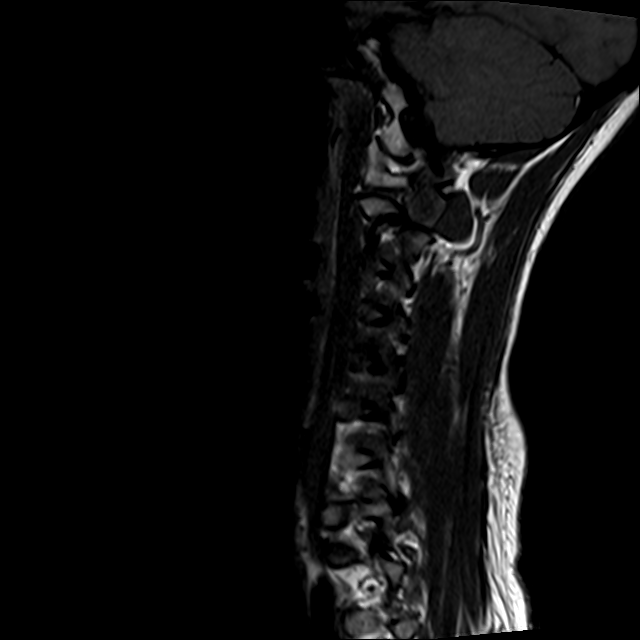
[im 8/13]
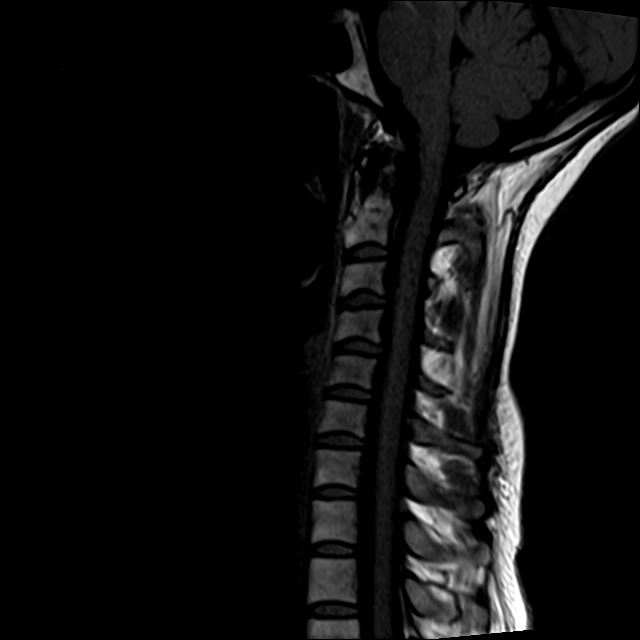
[im 13/13]
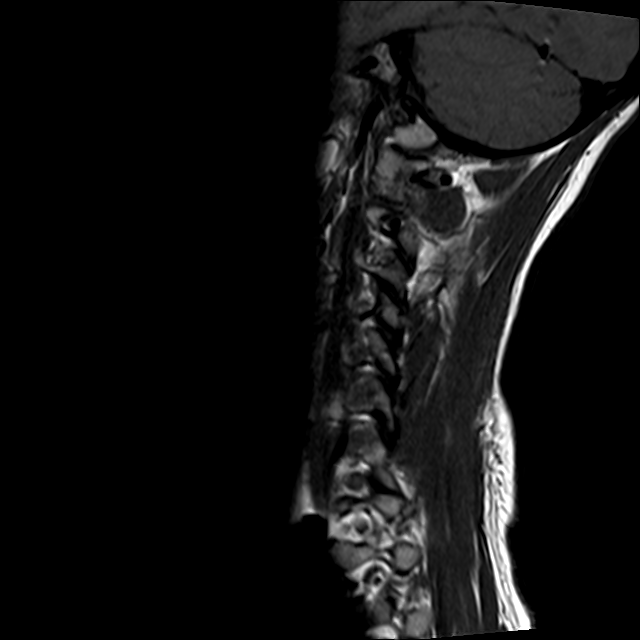

[Series 5: STIR · sagittal · 3.0mm · 0.34mm/px · 3 of 13 slices shown]
[im 3/13]
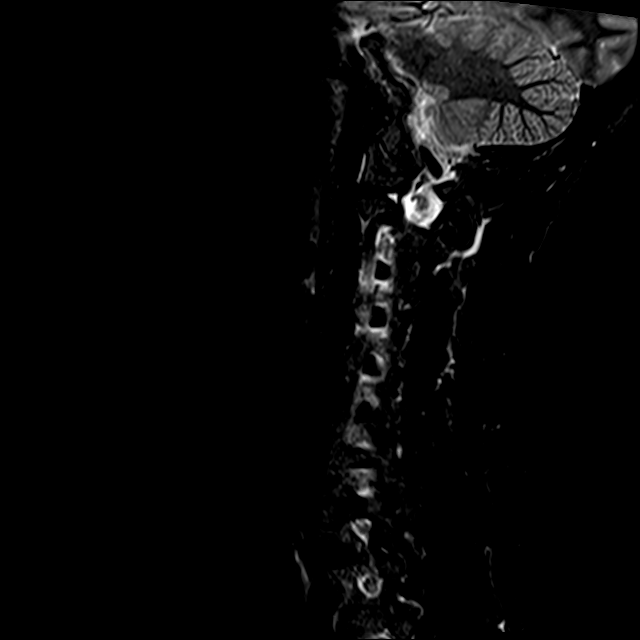
[im 8/13]
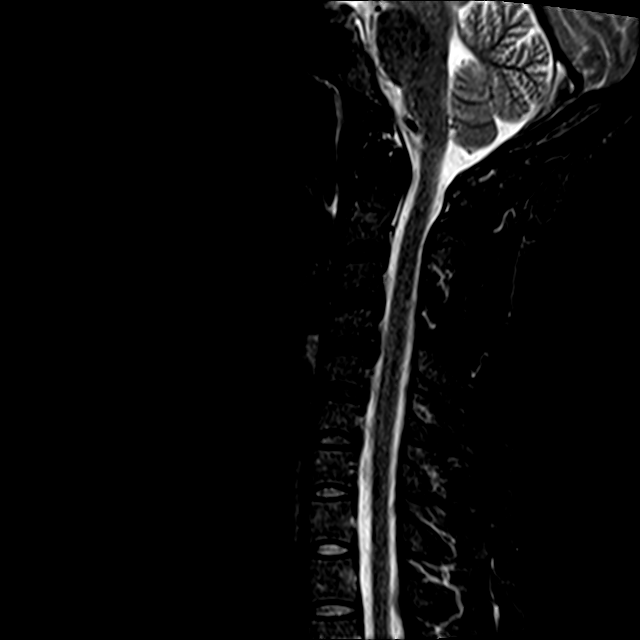
[im 13/13]
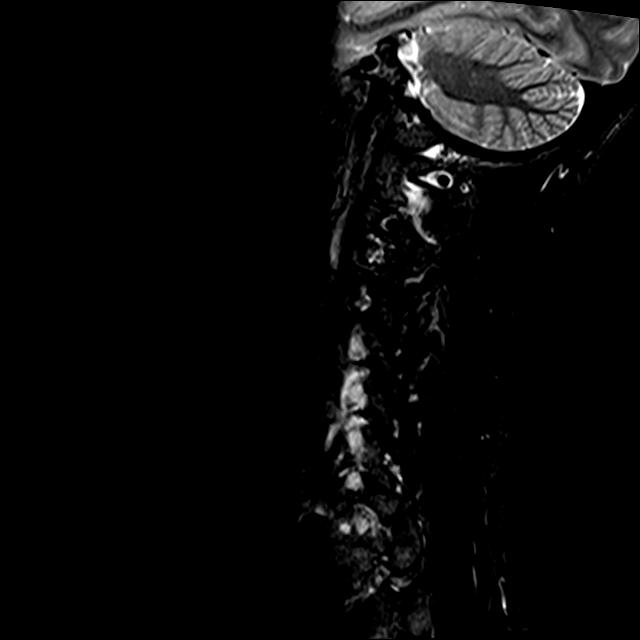

[Series 6: T2 · axial · 3.0mm · 0.28mm/px · z∈[-47,+58]mm · 5 of 32 slices shown (2 of 2)]
[im 1/32]
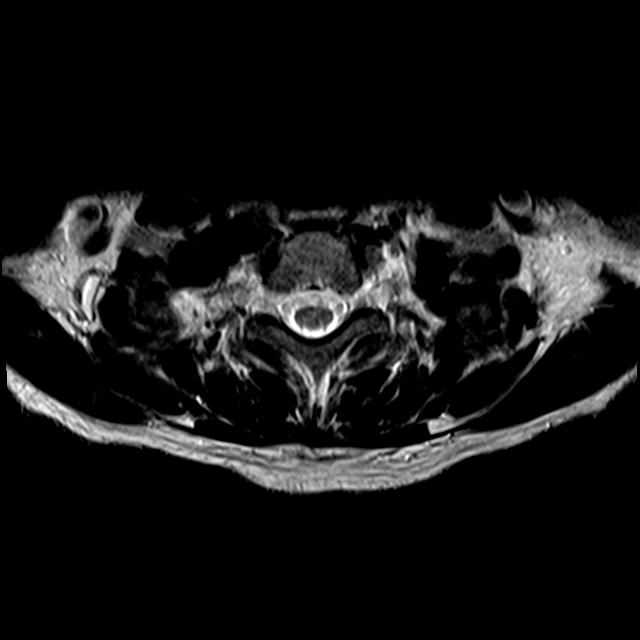
[im 5/32]
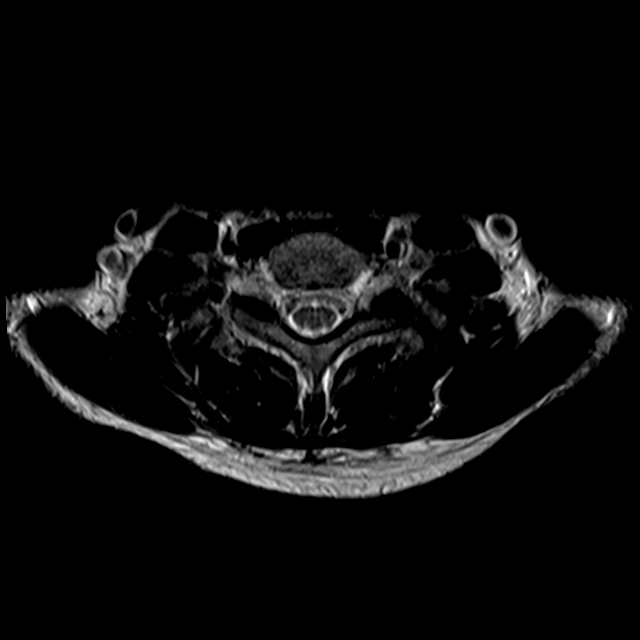
[im 9/32]
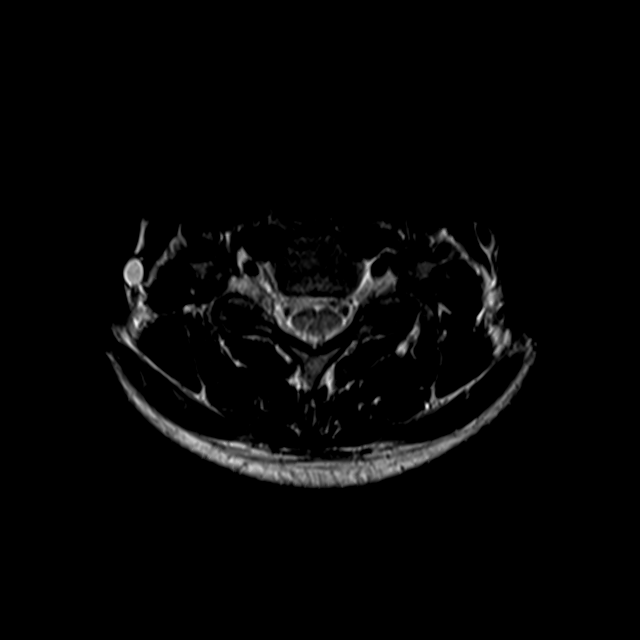
[im 16/32]
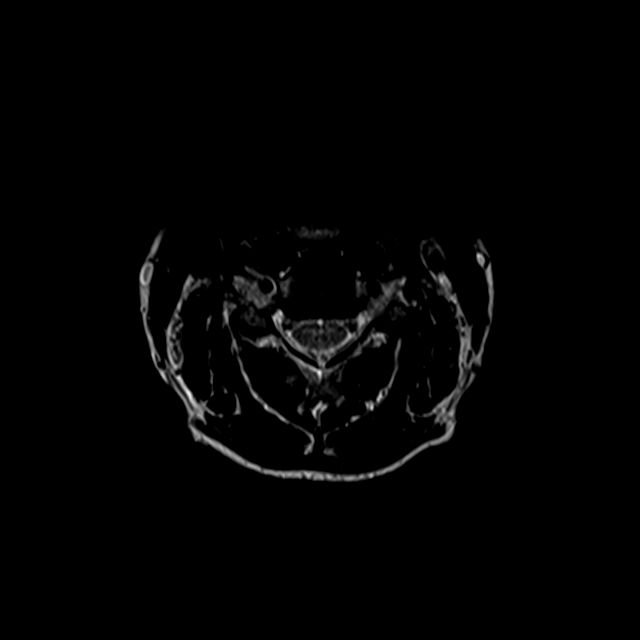
[im 27/32]
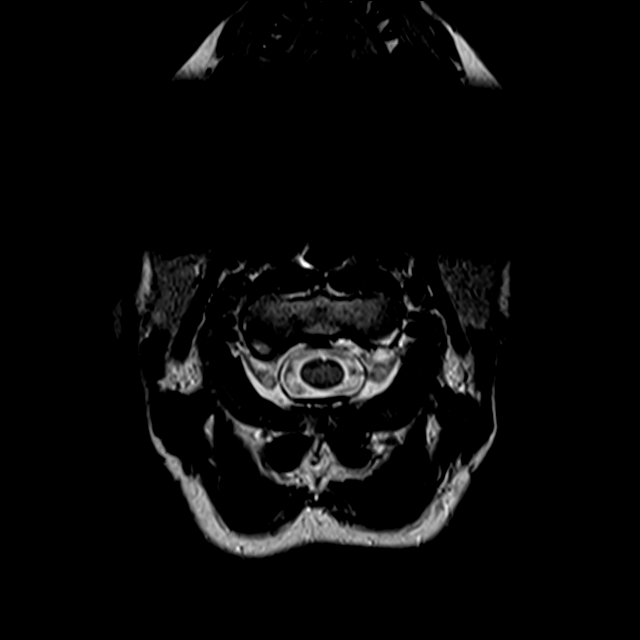

[16 of 48 positions shown; findings below may reference images not displayed]

FINDINGS: Alignment: Straightening of the normal cervical lordosis. No
listhesis.

Vertebrae: Vertebral body height maintained without acute or chronic
fracture. Bone marrow signal intensity within normal limits. Small
benign hemangioma noted within the T2 vertebral body. No other
discrete or worrisome osseous lesions. No abnormal marrow edema.

Cord: Normal signal and morphology.

Posterior Fossa, vertebral arteries, paraspinal tissues: Visualized
brain and posterior fossa within normal limits. Craniocervical
junction normal. Paraspinous and prevertebral soft tissues within
normal limits. Normal intravascular flow voids seen within the
vertebral arteries bilaterally.

Disc levels:

C2-C3: Unremarkable.

C3-C4: Minimal annular disc bulge with right-sided facet
hypertrophy. No spinal stenosis. Foramina remain patent.

C4-C5: Mild annular disc bulge with uncovertebral spurring. No
significant spinal stenosis. Foramina remain patent.

C5-C6:  Unremarkable.

C6-C7:  Unremarkable.

C7-T1:  Unremarkable.

Visualized upper thoracic spine demonstrates no significant finding.
IMPRESSION: 1. Minimal noncompressive disc bulging at C3-4 and C4-5 without
stenosis or neural impingement.
2. Otherwise unremarkable MRI of the cervical spine.

## 2022-07-02 ENCOUNTER — Encounter: Payer: BC Managed Care – PPO | Admitting: Internal Medicine

## 2022-07-03 DIAGNOSIS — M546 Pain in thoracic spine: Secondary | ICD-10-CM | POA: Diagnosis not present

## 2022-07-03 DIAGNOSIS — M542 Cervicalgia: Secondary | ICD-10-CM | POA: Diagnosis not present

## 2022-07-06 DIAGNOSIS — M25511 Pain in right shoulder: Secondary | ICD-10-CM | POA: Diagnosis not present

## 2022-07-06 DIAGNOSIS — M542 Cervicalgia: Secondary | ICD-10-CM | POA: Diagnosis not present

## 2022-07-06 DIAGNOSIS — M2550 Pain in unspecified joint: Secondary | ICD-10-CM | POA: Diagnosis not present

## 2022-07-06 DIAGNOSIS — M25512 Pain in left shoulder: Secondary | ICD-10-CM | POA: Diagnosis not present

## 2022-07-06 DIAGNOSIS — G8929 Other chronic pain: Secondary | ICD-10-CM | POA: Diagnosis not present

## 2022-07-09 DIAGNOSIS — I7300 Raynaud's syndrome without gangrene: Secondary | ICD-10-CM | POA: Diagnosis not present

## 2022-07-09 DIAGNOSIS — G629 Polyneuropathy, unspecified: Secondary | ICD-10-CM | POA: Diagnosis not present

## 2022-07-09 DIAGNOSIS — F17200 Nicotine dependence, unspecified, uncomplicated: Secondary | ICD-10-CM | POA: Diagnosis not present

## 2022-07-09 DIAGNOSIS — M2559 Pain in other specified joint: Secondary | ICD-10-CM | POA: Diagnosis not present

## 2022-07-16 ENCOUNTER — Ambulatory Visit: Payer: Medicaid Other | Admitting: Family Medicine

## 2022-07-16 ENCOUNTER — Encounter: Payer: Self-pay | Admitting: Family Medicine

## 2022-07-16 VITALS — BP 116/78 | Ht 69.0 in | Wt 145.0 lb

## 2022-07-16 DIAGNOSIS — M5412 Radiculopathy, cervical region: Secondary | ICD-10-CM | POA: Diagnosis not present

## 2022-07-16 DIAGNOSIS — M7918 Myalgia, other site: Secondary | ICD-10-CM | POA: Diagnosis not present

## 2022-07-16 MED ORDER — METHYLPREDNISOLONE ACETATE 40 MG/ML IJ SUSP
40.0000 mg | Freq: Once | INTRAMUSCULAR | Status: AC
  Administered 2022-07-16: 40 mg via INTRAMUSCULAR

## 2022-07-16 MED ORDER — KETOROLAC TROMETHAMINE 30 MG/ML IJ SOLN
30.0000 mg | Freq: Once | INTRAMUSCULAR | Status: AC
  Administered 2022-07-16: 30 mg via INTRAMUSCULAR

## 2022-07-16 NOTE — Progress Notes (Signed)
  Sylvanna Mizerak - 42 y.o. female MRN [id number]  Date of birth: April 18, 1980  SUBJECTIVE:  Including CC & ROS.  No chief complaint on file.   Ophia Kuehnle is a 42 y.o. female that is presenting with acute on chronic periscapular pain and right shoulder pain.  Her current pain is debilitating.  She is unable to work due to the pain.  She has a referral in place but is in a 6-week wait list to see rheumatology.    Review of Systems See HPI   HISTORY: Past Medical, Surgical, Social, and Family History Reviewed & Updated per EMR.   Pertinent Historical Findings include:  Past Medical History:  Diagnosis Date   Hypermobility syndrome    Myofascial pain syndrome    Tobacco use     Past Surgical History:  Procedure Laterality Date   ABDOMINAL HYSTERECTOMY     EYE SURGERY     FRACTURE SURGERY       PHYSICAL EXAM:  VS: BP 116/78 (BP Location: Left Arm, Patient Position: Sitting)   Ht 5\' 9"  (1.753 m)   Wt 145 lb (65.8 kg)   BMI 21.41 kg/m  Physical Exam Gen: NAD, alert, cooperative with exam, well-appearing MSK:  Neurovascularly intact       ASSESSMENT & PLAN:   Cervical radiculopathy Acute on chronic in nature.  She has pain that is debilitating and she is unable to work. -Counseled on home exercise therapy and supportive care. -IM Depo-Medrol and Toradol. -Completed paperwork for work.  Myofascial pain syndrome Acute on chronic in nature.  She has tried nortriptyline, naltrexone and Cymbalta in the past.  She has not had any improvement with previous medications.  She has been in physical therapy.  Previous EMG studies have been unrevealing.  Was considering a nerve biopsy  - she has referral in place for rheumatology. -Completed paperwork

## 2022-07-16 NOTE — Assessment & Plan Note (Signed)
Acute on chronic in nature.  She has pain that is debilitating and she is unable to work. -Counseled on home exercise therapy and supportive care. -IM Depo-Medrol and Toradol. -Completed paperwork for work.

## 2022-07-16 NOTE — Patient Instructions (Signed)
Good to see you °Please use heat as needed   °Please send me a message in MyChart with any questions or updates.  °Please see me back as needed.  ° °--Dr. Shaan Rhoads ° °

## 2022-07-16 NOTE — Assessment & Plan Note (Addendum)
Acute on chronic in nature.  She has tried nortriptyline, naltrexone and Cymbalta in the past.  She has not had any improvement with previous medications.  She has been in physical therapy.  Previous EMG studies have been unrevealing.  Was considering a nerve biopsy  - she has referral in place for rheumatology. -Completed paperwork

## 2022-07-23 ENCOUNTER — Encounter: Payer: Self-pay | Admitting: *Deleted

## 2022-08-14 DIAGNOSIS — K529 Noninfective gastroenteritis and colitis, unspecified: Secondary | ICD-10-CM | POA: Diagnosis not present

## 2022-08-14 DIAGNOSIS — E559 Vitamin D deficiency, unspecified: Secondary | ICD-10-CM | POA: Diagnosis not present

## 2022-08-14 DIAGNOSIS — K909 Intestinal malabsorption, unspecified: Secondary | ICD-10-CM | POA: Diagnosis not present

## 2022-08-15 DIAGNOSIS — K529 Noninfective gastroenteritis and colitis, unspecified: Secondary | ICD-10-CM | POA: Diagnosis not present

## 2022-08-23 DIAGNOSIS — M7918 Myalgia, other site: Secondary | ICD-10-CM | POA: Diagnosis not present

## 2022-08-23 DIAGNOSIS — Z114 Encounter for screening for human immunodeficiency virus [HIV]: Secondary | ICD-10-CM | POA: Diagnosis not present

## 2022-08-23 DIAGNOSIS — E7801 Familial hypercholesterolemia: Secondary | ICD-10-CM | POA: Diagnosis not present

## 2022-08-23 DIAGNOSIS — G8929 Other chronic pain: Secondary | ICD-10-CM | POA: Diagnosis not present

## 2022-08-23 DIAGNOSIS — I7300 Raynaud's syndrome without gangrene: Secondary | ICD-10-CM | POA: Diagnosis not present

## 2022-08-23 DIAGNOSIS — Z1239 Encounter for other screening for malignant neoplasm of breast: Secondary | ICD-10-CM | POA: Diagnosis not present

## 2022-08-23 DIAGNOSIS — G2589 Other specified extrapyramidal and movement disorders: Secondary | ICD-10-CM | POA: Diagnosis not present

## 2022-08-23 DIAGNOSIS — M25551 Pain in right hip: Secondary | ICD-10-CM | POA: Diagnosis not present

## 2022-08-23 DIAGNOSIS — Z7689 Persons encountering health services in other specified circumstances: Secondary | ICD-10-CM | POA: Diagnosis not present

## 2022-08-23 DIAGNOSIS — K529 Noninfective gastroenteritis and colitis, unspecified: Secondary | ICD-10-CM | POA: Diagnosis not present

## 2022-08-23 DIAGNOSIS — Z1159 Encounter for screening for other viral diseases: Secondary | ICD-10-CM | POA: Diagnosis not present

## 2022-08-23 DIAGNOSIS — Z83438 Family history of other disorder of lipoprotein metabolism and other lipidemia: Secondary | ICD-10-CM | POA: Diagnosis not present

## 2022-08-23 DIAGNOSIS — M25561 Pain in right knee: Secondary | ICD-10-CM | POA: Diagnosis not present

## 2022-08-24 DIAGNOSIS — Z1231 Encounter for screening mammogram for malignant neoplasm of breast: Secondary | ICD-10-CM | POA: Diagnosis not present

## 2022-09-05 DIAGNOSIS — Z87891 Personal history of nicotine dependence: Secondary | ICD-10-CM | POA: Diagnosis not present

## 2022-09-05 DIAGNOSIS — Z720 Tobacco use: Secondary | ICD-10-CM | POA: Diagnosis not present

## 2022-09-05 DIAGNOSIS — R4589 Other symptoms and signs involving emotional state: Secondary | ICD-10-CM | POA: Diagnosis not present

## 2022-09-05 DIAGNOSIS — D8989 Other specified disorders involving the immune mechanism, not elsewhere classified: Secondary | ICD-10-CM | POA: Diagnosis not present

## 2022-09-06 DIAGNOSIS — G8929 Other chronic pain: Secondary | ICD-10-CM | POA: Diagnosis not present

## 2022-09-21 DIAGNOSIS — K6389 Other specified diseases of intestine: Secondary | ICD-10-CM | POA: Diagnosis not present

## 2022-09-21 DIAGNOSIS — Z86010 Personal history of colonic polyps: Secondary | ICD-10-CM | POA: Diagnosis not present

## 2022-09-21 DIAGNOSIS — K640 First degree hemorrhoids: Secondary | ICD-10-CM | POA: Diagnosis not present

## 2022-09-21 DIAGNOSIS — K5730 Diverticulosis of large intestine without perforation or abscess without bleeding: Secondary | ICD-10-CM | POA: Diagnosis not present

## 2022-09-21 DIAGNOSIS — K529 Noninfective gastroenteritis and colitis, unspecified: Secondary | ICD-10-CM | POA: Diagnosis not present

## 2022-09-21 DIAGNOSIS — R197 Diarrhea, unspecified: Secondary | ICD-10-CM | POA: Diagnosis not present

## 2022-09-24 DIAGNOSIS — K2289 Other specified disease of esophagus: Secondary | ICD-10-CM | POA: Diagnosis not present

## 2022-09-24 DIAGNOSIS — K3189 Other diseases of stomach and duodenum: Secondary | ICD-10-CM | POA: Diagnosis not present

## 2022-09-24 DIAGNOSIS — R1319 Other dysphagia: Secondary | ICD-10-CM | POA: Diagnosis not present

## 2022-09-24 DIAGNOSIS — K219 Gastro-esophageal reflux disease without esophagitis: Secondary | ICD-10-CM | POA: Diagnosis not present

## 2022-09-24 DIAGNOSIS — R1310 Dysphagia, unspecified: Secondary | ICD-10-CM | POA: Diagnosis not present

## 2022-09-24 DIAGNOSIS — K2950 Unspecified chronic gastritis without bleeding: Secondary | ICD-10-CM | POA: Diagnosis not present

## 2022-09-29 DIAGNOSIS — M7918 Myalgia, other site: Secondary | ICD-10-CM | POA: Diagnosis not present

## 2022-09-29 DIAGNOSIS — K529 Noninfective gastroenteritis and colitis, unspecified: Secondary | ICD-10-CM | POA: Diagnosis not present

## 2022-10-15 DIAGNOSIS — F17210 Nicotine dependence, cigarettes, uncomplicated: Secondary | ICD-10-CM | POA: Diagnosis not present

## 2022-10-15 DIAGNOSIS — R519 Headache, unspecified: Secondary | ICD-10-CM | POA: Diagnosis not present

## 2022-10-15 DIAGNOSIS — W19XXXA Unspecified fall, initial encounter: Secondary | ICD-10-CM | POA: Diagnosis not present

## 2022-10-15 DIAGNOSIS — S0990XA Unspecified injury of head, initial encounter: Secondary | ICD-10-CM | POA: Diagnosis not present

## 2022-10-15 DIAGNOSIS — S199XXA Unspecified injury of neck, initial encounter: Secondary | ICD-10-CM | POA: Diagnosis not present

## 2022-10-15 DIAGNOSIS — M542 Cervicalgia: Secondary | ICD-10-CM | POA: Diagnosis not present

## 2022-11-05 DIAGNOSIS — R1310 Dysphagia, unspecified: Secondary | ICD-10-CM | POA: Diagnosis not present

## 2022-11-05 DIAGNOSIS — Z79899 Other long term (current) drug therapy: Secondary | ICD-10-CM | POA: Diagnosis not present

## 2022-11-05 DIAGNOSIS — K3189 Other diseases of stomach and duodenum: Secondary | ICD-10-CM | POA: Diagnosis not present

## 2022-11-05 DIAGNOSIS — K5730 Diverticulosis of large intestine without perforation or abscess without bleeding: Secondary | ICD-10-CM | POA: Diagnosis not present

## 2022-11-05 DIAGNOSIS — K640 First degree hemorrhoids: Secondary | ICD-10-CM | POA: Diagnosis not present

## 2022-11-05 DIAGNOSIS — K2289 Other specified disease of esophagus: Secondary | ICD-10-CM | POA: Diagnosis not present

## 2022-11-05 DIAGNOSIS — K6289 Other specified diseases of anus and rectum: Secondary | ICD-10-CM | POA: Diagnosis not present

## 2022-11-05 DIAGNOSIS — K529 Noninfective gastroenteritis and colitis, unspecified: Secondary | ICD-10-CM | POA: Diagnosis not present

## 2022-11-13 DIAGNOSIS — L255 Unspecified contact dermatitis due to plants, except food: Secondary | ICD-10-CM | POA: Diagnosis not present

## 2022-11-13 DIAGNOSIS — M7918 Myalgia, other site: Secondary | ICD-10-CM | POA: Diagnosis not present

## 2022-11-13 DIAGNOSIS — Z6820 Body mass index (BMI) 20.0-20.9, adult: Secondary | ICD-10-CM | POA: Diagnosis not present

## 2022-12-02 DIAGNOSIS — Z23 Encounter for immunization: Secondary | ICD-10-CM | POA: Diagnosis not present

## 2022-12-04 DIAGNOSIS — L255 Unspecified contact dermatitis due to plants, except food: Secondary | ICD-10-CM | POA: Diagnosis not present

## 2022-12-04 DIAGNOSIS — Z6820 Body mass index (BMI) 20.0-20.9, adult: Secondary | ICD-10-CM | POA: Diagnosis not present

## 2023-02-05 DIAGNOSIS — J329 Chronic sinusitis, unspecified: Secondary | ICD-10-CM | POA: Diagnosis not present

## 2023-02-05 DIAGNOSIS — Z6820 Body mass index (BMI) 20.0-20.9, adult: Secondary | ICD-10-CM | POA: Diagnosis not present

## 2023-02-05 DIAGNOSIS — B9689 Other specified bacterial agents as the cause of diseases classified elsewhere: Secondary | ICD-10-CM | POA: Diagnosis not present

## 2023-02-18 DIAGNOSIS — M45A0 Non-radiographic axial spondyloarthritis of unspecified sites in spine: Secondary | ICD-10-CM | POA: Diagnosis not present

## 2023-02-18 DIAGNOSIS — M4802 Spinal stenosis, cervical region: Secondary | ICD-10-CM | POA: Diagnosis not present

## 2023-02-18 DIAGNOSIS — Q796 Ehlers-Danlos syndrome, unspecified: Secondary | ICD-10-CM | POA: Diagnosis not present

## 2023-02-18 DIAGNOSIS — M542 Cervicalgia: Secondary | ICD-10-CM | POA: Diagnosis not present

## 2023-02-18 DIAGNOSIS — M459 Ankylosing spondylitis of unspecified sites in spine: Secondary | ICD-10-CM | POA: Diagnosis not present

## 2023-02-18 DIAGNOSIS — M47816 Spondylosis without myelopathy or radiculopathy, lumbar region: Secondary | ICD-10-CM | POA: Diagnosis not present

## 2023-03-26 DIAGNOSIS — Z6820 Body mass index (BMI) 20.0-20.9, adult: Secondary | ICD-10-CM | POA: Diagnosis not present

## 2023-03-26 DIAGNOSIS — D582 Other hemoglobinopathies: Secondary | ICD-10-CM | POA: Diagnosis not present

## 2023-03-26 DIAGNOSIS — E559 Vitamin D deficiency, unspecified: Secondary | ICD-10-CM | POA: Diagnosis not present

## 2023-04-18 DIAGNOSIS — J9809 Other diseases of bronchus, not elsewhere classified: Secondary | ICD-10-CM | POA: Diagnosis not present

## 2023-04-18 DIAGNOSIS — N200 Calculus of kidney: Secondary | ICD-10-CM | POA: Diagnosis not present

## 2023-04-18 DIAGNOSIS — R21 Rash and other nonspecific skin eruption: Secondary | ICD-10-CM | POA: Diagnosis not present

## 2023-04-18 DIAGNOSIS — R079 Chest pain, unspecified: Secondary | ICD-10-CM | POA: Diagnosis not present

## 2023-04-18 DIAGNOSIS — N83202 Unspecified ovarian cyst, left side: Secondary | ICD-10-CM | POA: Diagnosis not present

## 2023-04-22 DIAGNOSIS — M329 Systemic lupus erythematosus, unspecified: Secondary | ICD-10-CM | POA: Diagnosis not present

## 2023-04-22 DIAGNOSIS — I7300 Raynaud's syndrome without gangrene: Secondary | ICD-10-CM | POA: Diagnosis not present

## 2023-04-22 DIAGNOSIS — M357 Hypermobility syndrome: Secondary | ICD-10-CM | POA: Diagnosis not present

## 2023-04-22 DIAGNOSIS — L309 Dermatitis, unspecified: Secondary | ICD-10-CM | POA: Diagnosis not present

## 2023-04-22 DIAGNOSIS — R0789 Other chest pain: Secondary | ICD-10-CM | POA: Diagnosis not present

## 2023-04-22 DIAGNOSIS — Z6821 Body mass index (BMI) 21.0-21.9, adult: Secondary | ICD-10-CM | POA: Diagnosis not present

## 2023-04-22 DIAGNOSIS — Q796 Ehlers-Danlos syndrome, unspecified: Secondary | ICD-10-CM | POA: Diagnosis not present

## 2023-04-22 DIAGNOSIS — Z09 Encounter for follow-up examination after completed treatment for conditions other than malignant neoplasm: Secondary | ICD-10-CM | POA: Diagnosis not present

## 2023-04-24 DIAGNOSIS — K029 Dental caries, unspecified: Secondary | ICD-10-CM | POA: Diagnosis not present

## 2023-04-24 DIAGNOSIS — K08539 Fractured dental restorative material, unspecified: Secondary | ICD-10-CM | POA: Diagnosis not present

## 2023-06-25 DIAGNOSIS — M45A0 Non-radiographic axial spondyloarthritis of unspecified sites in spine: Secondary | ICD-10-CM | POA: Diagnosis not present

## 2023-06-25 DIAGNOSIS — R7989 Other specified abnormal findings of blood chemistry: Secondary | ICD-10-CM | POA: Diagnosis not present

## 2023-06-25 DIAGNOSIS — H04123 Dry eye syndrome of bilateral lacrimal glands: Secondary | ICD-10-CM | POA: Diagnosis not present

## 2023-06-25 DIAGNOSIS — B028 Zoster with other complications: Secondary | ICD-10-CM | POA: Diagnosis not present

## 2023-06-25 DIAGNOSIS — M47899 Other spondylosis, site unspecified: Secondary | ICD-10-CM | POA: Diagnosis not present

## 2023-06-25 DIAGNOSIS — M2550 Pain in unspecified joint: Secondary | ICD-10-CM | POA: Diagnosis not present

## 2023-06-25 DIAGNOSIS — I7300 Raynaud's syndrome without gangrene: Secondary | ICD-10-CM | POA: Diagnosis not present

## 2023-06-25 DIAGNOSIS — G9089 Other disorders of autonomic nervous system: Secondary | ICD-10-CM | POA: Diagnosis not present

## 2023-06-28 DIAGNOSIS — Z8249 Family history of ischemic heart disease and other diseases of the circulatory system: Secondary | ICD-10-CM | POA: Diagnosis not present

## 2023-06-28 DIAGNOSIS — Z1331 Encounter for screening for depression: Secondary | ICD-10-CM | POA: Diagnosis not present

## 2023-06-28 DIAGNOSIS — I209 Angina pectoris, unspecified: Secondary | ICD-10-CM | POA: Diagnosis not present

## 2023-08-28 DIAGNOSIS — Z6821 Body mass index (BMI) 21.0-21.9, adult: Secondary | ICD-10-CM | POA: Diagnosis not present

## 2023-08-28 DIAGNOSIS — E559 Vitamin D deficiency, unspecified: Secondary | ICD-10-CM | POA: Diagnosis not present

## 2023-08-28 DIAGNOSIS — Z1239 Encounter for other screening for malignant neoplasm of breast: Secondary | ICD-10-CM | POA: Diagnosis not present

## 2023-08-28 DIAGNOSIS — Z1329 Encounter for screening for other suspected endocrine disorder: Secondary | ICD-10-CM | POA: Diagnosis not present

## 2023-08-28 DIAGNOSIS — B079 Viral wart, unspecified: Secondary | ICD-10-CM | POA: Diagnosis not present

## 2023-08-28 DIAGNOSIS — L930 Discoid lupus erythematosus: Secondary | ICD-10-CM | POA: Diagnosis not present

## 2023-08-28 DIAGNOSIS — Z131 Encounter for screening for diabetes mellitus: Secondary | ICD-10-CM | POA: Diagnosis not present

## 2023-08-28 DIAGNOSIS — K580 Irritable bowel syndrome with diarrhea: Secondary | ICD-10-CM | POA: Diagnosis not present

## 2023-08-28 DIAGNOSIS — Z13220 Encounter for screening for lipoid disorders: Secondary | ICD-10-CM | POA: Diagnosis not present

## 2023-08-28 DIAGNOSIS — Z0000 Encounter for general adult medical examination without abnormal findings: Secondary | ICD-10-CM | POA: Diagnosis not present

## 2023-08-28 DIAGNOSIS — F5102 Adjustment insomnia: Secondary | ICD-10-CM | POA: Diagnosis not present

## 2023-09-03 DIAGNOSIS — Z8249 Family history of ischemic heart disease and other diseases of the circulatory system: Secondary | ICD-10-CM | POA: Diagnosis not present

## 2023-09-03 DIAGNOSIS — R0789 Other chest pain: Secondary | ICD-10-CM | POA: Diagnosis not present

## 2023-09-11 DIAGNOSIS — Z1231 Encounter for screening mammogram for malignant neoplasm of breast: Secondary | ICD-10-CM | POA: Diagnosis not present

## 2023-10-10 DIAGNOSIS — L538 Other specified erythematous conditions: Secondary | ICD-10-CM | POA: Diagnosis not present

## 2023-10-10 DIAGNOSIS — L0889 Other specified local infections of the skin and subcutaneous tissue: Secondary | ICD-10-CM | POA: Diagnosis not present

## 2023-10-10 DIAGNOSIS — R208 Other disturbances of skin sensation: Secondary | ICD-10-CM | POA: Diagnosis not present

## 2023-10-10 DIAGNOSIS — B078 Other viral warts: Secondary | ICD-10-CM | POA: Diagnosis not present

## 2023-10-10 DIAGNOSIS — R238 Other skin changes: Secondary | ICD-10-CM | POA: Diagnosis not present

## 2023-10-10 DIAGNOSIS — N3000 Acute cystitis without hematuria: Secondary | ICD-10-CM | POA: Diagnosis not present

## 2023-10-19 DIAGNOSIS — Z720 Tobacco use: Secondary | ICD-10-CM | POA: Diagnosis not present

## 2023-10-19 DIAGNOSIS — G8929 Other chronic pain: Secondary | ICD-10-CM | POA: Diagnosis not present

## 2023-10-24 DIAGNOSIS — Z87440 Personal history of urinary (tract) infections: Secondary | ICD-10-CM | POA: Diagnosis not present

## 2023-10-24 DIAGNOSIS — N390 Urinary tract infection, site not specified: Secondary | ICD-10-CM | POA: Diagnosis not present

## 2023-10-24 DIAGNOSIS — Z6821 Body mass index (BMI) 21.0-21.9, adult: Secondary | ICD-10-CM | POA: Diagnosis not present

## 2023-10-24 DIAGNOSIS — M329 Systemic lupus erythematosus, unspecified: Secondary | ICD-10-CM | POA: Diagnosis not present

## 2023-10-24 DIAGNOSIS — Z09 Encounter for follow-up examination after completed treatment for conditions other than malignant neoplasm: Secondary | ICD-10-CM | POA: Diagnosis not present

## 2023-10-24 DIAGNOSIS — K2900 Acute gastritis without bleeding: Secondary | ICD-10-CM | POA: Diagnosis not present

## 2023-10-25 DIAGNOSIS — M45A0 Non-radiographic axial spondyloarthritis of unspecified sites in spine: Secondary | ICD-10-CM | POA: Diagnosis not present

## 2023-10-25 DIAGNOSIS — L4050 Arthropathic psoriasis, unspecified: Secondary | ICD-10-CM | POA: Diagnosis not present

## 2023-10-25 DIAGNOSIS — M450 Ankylosing spondylitis of multiple sites in spine: Secondary | ICD-10-CM | POA: Diagnosis not present

## 2023-11-01 DIAGNOSIS — N6312 Unspecified lump in the right breast, upper inner quadrant: Secondary | ICD-10-CM | POA: Diagnosis not present

## 2023-11-01 DIAGNOSIS — R92331 Mammographic heterogeneous density, right breast: Secondary | ICD-10-CM | POA: Diagnosis not present

## 2023-11-12 DIAGNOSIS — L538 Other specified erythematous conditions: Secondary | ICD-10-CM | POA: Diagnosis not present

## 2023-11-12 DIAGNOSIS — R238 Other skin changes: Secondary | ICD-10-CM | POA: Diagnosis not present

## 2023-11-12 DIAGNOSIS — L0889 Other specified local infections of the skin and subcutaneous tissue: Secondary | ICD-10-CM | POA: Diagnosis not present

## 2023-11-12 DIAGNOSIS — B078 Other viral warts: Secondary | ICD-10-CM | POA: Diagnosis not present

## 2023-11-12 DIAGNOSIS — R208 Other disturbances of skin sensation: Secondary | ICD-10-CM | POA: Diagnosis not present

## 2023-12-02 DIAGNOSIS — N6312 Unspecified lump in the right breast, upper inner quadrant: Secondary | ICD-10-CM | POA: Diagnosis not present

## 2023-12-13 DIAGNOSIS — Z860100 Personal history of colon polyps, unspecified: Secondary | ICD-10-CM | POA: Diagnosis not present

## 2023-12-13 DIAGNOSIS — R1031 Right lower quadrant pain: Secondary | ICD-10-CM | POA: Diagnosis not present

## 2023-12-13 DIAGNOSIS — K219 Gastro-esophageal reflux disease without esophagitis: Secondary | ICD-10-CM | POA: Diagnosis not present

## 2023-12-13 DIAGNOSIS — R6881 Early satiety: Secondary | ICD-10-CM | POA: Diagnosis not present

## 2023-12-13 DIAGNOSIS — R1110 Vomiting, unspecified: Secondary | ICD-10-CM | POA: Diagnosis not present

## 2023-12-13 DIAGNOSIS — R140 Abdominal distension (gaseous): Secondary | ICD-10-CM | POA: Diagnosis not present

## 2023-12-13 DIAGNOSIS — F17290 Nicotine dependence, other tobacco product, uncomplicated: Secondary | ICD-10-CM | POA: Diagnosis not present

## 2023-12-13 DIAGNOSIS — Z79899 Other long term (current) drug therapy: Secondary | ICD-10-CM | POA: Diagnosis not present

## 2023-12-13 DIAGNOSIS — R194 Change in bowel habit: Secondary | ICD-10-CM | POA: Diagnosis not present

## 2023-12-13 DIAGNOSIS — Z8719 Personal history of other diseases of the digestive system: Secondary | ICD-10-CM | POA: Diagnosis not present

## 2023-12-15 DIAGNOSIS — G44201 Tension-type headache, unspecified, intractable: Secondary | ICD-10-CM | POA: Diagnosis not present

## 2023-12-17 DIAGNOSIS — B078 Other viral warts: Secondary | ICD-10-CM | POA: Diagnosis not present

## 2023-12-25 DIAGNOSIS — K219 Gastro-esophageal reflux disease without esophagitis: Secondary | ICD-10-CM | POA: Diagnosis not present

## 2023-12-25 DIAGNOSIS — K2950 Unspecified chronic gastritis without bleeding: Secondary | ICD-10-CM | POA: Diagnosis not present

## 2023-12-25 DIAGNOSIS — K2289 Other specified disease of esophagus: Secondary | ICD-10-CM | POA: Diagnosis not present

## 2024-01-28 DIAGNOSIS — K3189 Other diseases of stomach and duodenum: Secondary | ICD-10-CM | POA: Diagnosis not present

## 2024-01-28 DIAGNOSIS — K529 Noninfective gastroenteritis and colitis, unspecified: Secondary | ICD-10-CM | POA: Diagnosis not present

## 2024-01-28 DIAGNOSIS — K2270 Barrett's esophagus without dysplasia: Secondary | ICD-10-CM | POA: Diagnosis not present

## 2024-01-28 DIAGNOSIS — R140 Abdominal distension (gaseous): Secondary | ICD-10-CM | POA: Diagnosis not present

## 2024-01-28 DIAGNOSIS — K219 Gastro-esophageal reflux disease without esophagitis: Secondary | ICD-10-CM | POA: Diagnosis not present

## 2024-02-14 DIAGNOSIS — R109 Unspecified abdominal pain: Secondary | ICD-10-CM | POA: Diagnosis not present

## 2024-02-17 DIAGNOSIS — R300 Dysuria: Secondary | ICD-10-CM | POA: Diagnosis not present

## 2024-03-26 DIAGNOSIS — M47899 Other spondylosis, site unspecified: Secondary | ICD-10-CM | POA: Diagnosis not present

## 2024-03-26 DIAGNOSIS — N390 Urinary tract infection, site not specified: Secondary | ICD-10-CM | POA: Diagnosis not present

## 2024-03-26 DIAGNOSIS — M2550 Pain in unspecified joint: Secondary | ICD-10-CM | POA: Diagnosis not present

## 2024-03-26 DIAGNOSIS — M450 Ankylosing spondylitis of multiple sites in spine: Secondary | ICD-10-CM | POA: Diagnosis not present

## 2024-03-26 DIAGNOSIS — L4050 Arthropathic psoriasis, unspecified: Secondary | ICD-10-CM | POA: Diagnosis not present

## 2024-03-26 DIAGNOSIS — B3781 Candidal esophagitis: Secondary | ICD-10-CM | POA: Diagnosis not present

## 2024-03-31 DIAGNOSIS — N200 Calculus of kidney: Secondary | ICD-10-CM | POA: Diagnosis not present

## 2024-03-31 DIAGNOSIS — R109 Unspecified abdominal pain: Secondary | ICD-10-CM | POA: Diagnosis not present
